# Patient Record
Sex: Female | Born: 1975 | Race: White | Hispanic: No | Marital: Married | Smoking: Never smoker
Health system: Southern US, Community
[De-identification: ages and names within clinical notes are randomized; demographics above are authoritative.]

## PROBLEM LIST (undated history)

## (undated) DIAGNOSIS — K219 Gastro-esophageal reflux disease without esophagitis: Secondary | ICD-10-CM

## (undated) DIAGNOSIS — J45909 Unspecified asthma, uncomplicated: Secondary | ICD-10-CM

## (undated) DIAGNOSIS — K589 Irritable bowel syndrome without diarrhea: Secondary | ICD-10-CM

## (undated) DIAGNOSIS — Z9889 Other specified postprocedural states: Secondary | ICD-10-CM

## (undated) DIAGNOSIS — R112 Nausea with vomiting, unspecified: Secondary | ICD-10-CM

## (undated) DIAGNOSIS — Z77120 Contact with and (suspected) exposure to mold (toxic): Secondary | ICD-10-CM

## (undated) DIAGNOSIS — D649 Anemia, unspecified: Secondary | ICD-10-CM

## (undated) HISTORY — DX: Irritable bowel syndrome, unspecified: K58.9

## (undated) HISTORY — DX: Contact with and (suspected) exposure to mold (toxic): Z77.120

---

## 1998-08-20 ENCOUNTER — Other Ambulatory Visit: Admission: RE | Admit: 1998-08-20 | Discharge: 1998-08-20 | Payer: Self-pay | Admitting: *Deleted

## 1999-09-09 ENCOUNTER — Other Ambulatory Visit: Admission: RE | Admit: 1999-09-09 | Discharge: 1999-09-09 | Payer: Self-pay | Admitting: *Deleted

## 2000-09-09 ENCOUNTER — Other Ambulatory Visit: Admission: RE | Admit: 2000-09-09 | Discharge: 2000-09-09 | Payer: Self-pay | Admitting: Obstetrics and Gynecology

## 2001-09-12 ENCOUNTER — Other Ambulatory Visit: Admission: RE | Admit: 2001-09-12 | Discharge: 2001-09-12 | Payer: Self-pay | Admitting: *Deleted

## 2002-09-19 ENCOUNTER — Other Ambulatory Visit: Admission: RE | Admit: 2002-09-19 | Discharge: 2002-09-19 | Payer: Self-pay | Admitting: *Deleted

## 2003-10-03 ENCOUNTER — Other Ambulatory Visit: Admission: RE | Admit: 2003-10-03 | Discharge: 2003-10-03 | Payer: Self-pay | Admitting: *Deleted

## 2004-12-17 ENCOUNTER — Other Ambulatory Visit: Admission: RE | Admit: 2004-12-17 | Discharge: 2004-12-17 | Payer: Self-pay | Admitting: *Deleted

## 2006-01-25 ENCOUNTER — Other Ambulatory Visit: Admission: RE | Admit: 2006-01-25 | Discharge: 2006-01-25 | Payer: Self-pay | Admitting: *Deleted

## 2007-03-04 ENCOUNTER — Other Ambulatory Visit: Admission: RE | Admit: 2007-03-04 | Discharge: 2007-03-04 | Payer: Self-pay | Admitting: *Deleted

## 2007-05-12 HISTORY — PX: CERVICAL CERCLAGE: SHX1329

## 2007-09-23 ENCOUNTER — Inpatient Hospital Stay (HOSPITAL_COMMUNITY): Admission: AD | Admit: 2007-09-23 | Discharge: 2007-09-23 | Payer: Self-pay | Admitting: Obstetrics and Gynecology

## 2007-09-28 ENCOUNTER — Inpatient Hospital Stay (HOSPITAL_COMMUNITY): Admission: AD | Admit: 2007-09-28 | Discharge: 2007-10-29 | Payer: Self-pay | Admitting: Obstetrics and Gynecology

## 2007-10-07 ENCOUNTER — Encounter: Payer: Self-pay | Admitting: Obstetrics and Gynecology

## 2007-10-25 ENCOUNTER — Encounter (INDEPENDENT_AMBULATORY_CARE_PROVIDER_SITE_OTHER): Payer: Self-pay | Admitting: Obstetrics & Gynecology

## 2007-10-31 ENCOUNTER — Encounter: Admission: RE | Admit: 2007-10-31 | Discharge: 2007-11-28 | Payer: Self-pay | Admitting: Obstetrics and Gynecology

## 2010-06-01 ENCOUNTER — Encounter: Payer: Self-pay | Admitting: Obstetrics and Gynecology

## 2010-09-23 NOTE — H&P (Signed)
Lisa Kelly, Lisa Kelly             ACCOUNT NO.:  [id number]   MEDICAL RECORD NO.:  [id number]           PATIENT TYPE:   LOCATION:                                 FACILITY:   PHYSICIAN:  Duke Salvia. Marcelle Overlie, M.D.DATE OF BIRTH:  08-20-1975   DATE OF ADMISSION:  DATE OF DISCHARGE:                              HISTORY & PHYSICAL   CHIEF COMPLAINT:  Preterm labor.   HISTORY OF PRESENT ILLNESS:  A 35 year old G1, P0, EDD is September 17,  22-6/7 weeks.  She has had an uncomplicated pregnancy with normal early  ultrasound except for showing a uterine fibroid that measures 7 x 5 x 6  cm at the fundus.  She has not had any previous cervical surgery.  Starting on May 15, she experienced some lower abdominal pain.  Cervix  was felt to be long and closed.  She was sent to [facility], was monitored,  received subcutaneous terbutaline, and was advised to come back today  for follow-up exam.  FFN was done today, and in the process of a routine  exam was noted to be dilated a centimeter and very thin.  An ultrasound  was done that showed breech presentation, fibroid was stable, there was  no measurable cervix with funneling noted.  We discussed incompetent  cervix and preterm labor, and she is admitted now for further  observation.   Blood type is A+.  For her past medical history, please see her  [facility] form.   PHYSICAL EXAMINATION:  Temperature 98.2, blood pressure 120/72.  HEENT:  Unremarkable.  NECK:  Supple without mass.  LUNGS:  Clear.  CARDIOVASCULAR:  Regular rate and rhythm without murmurs, rubs or  gallops.  BREASTS:  Not examined, 22-cm fundal height.  CERVIX:  Was 1, 90%, membranes intact.  EXTREMITIES/NEUROLOGIC:  Unremarkable.   IMPRESSION:  1. A 22-6/7 week intrauterine pregnancy.  2. Uterine fibroid is noted.  3. Premature labor.  4. Incompetent cervix.   PLAN:  Will admit for rest with tocolysis and consider possible cervical  cerclage.      Richard M. Marcelle Overlie, M.D.  Electronically Signed     RMH/MEDQ  D:  09/28/2007  T:  09/28/2007  Job:  [id number]

## 2010-09-23 NOTE — Op Note (Signed)
Lisa Kelly, BUSKIRK             ACCOUNT NO.:  [id number]   MEDICAL RECORD NO.:  [id number]          PATIENT TYPE:  OUT   LOCATION:  [facility]                           [facility]ACILITY:  [facility]   PHYSICIAN:  Zelphia Cairo, MD    DATE OF BIRTH:  Jul 30, 1975   DATE OF PROCEDURE:  09/29/2007  DATE OF DISCHARGE:                               OPERATIVE REPORT   PREOPERATIVE DIAGNOSIS:  Incompetent cervix.   POSTOPERATIVE DIAGNOSIS:  Incompetent cervix.   PROCEDURE:  Single McDonald rescue cerclage.   SURGEON:  Zelphia Cairo, MD   ASSISTANT:  Freddy Finner, MD   FINDINGS:  3 cm dilated cervix with exposed membranes.   ESTIMATED BLOOD LOSS:  Less than 50 mL.   COMPLICATIONS:  None.   ANESTHESIA:  Spinal.   PROCEDURE:  The patient was admitted to the hospital where she was noted  to have a cervix that was 1-cm dilated.  On ultrasound, there was no  measurable cervical length.  The patient was admitted to the hospital  where she was started on magnesium sulfate and Unasyn.  Risks, benefits,  and alternatives to rescue cerclage were discussed with the patient in  detail today.  We discussed the risks of ruptured membranes, infection,  preterm labor, and preterm delivery.  All of her questions were answered  satisfactorily and informed consent was obtained.   The patient was taken to the operating room where spinal anesthesia was  found to be adequate.  She was placed in the dorsal lithotomy position  using Allen stirrups.  She was prepped in sterile fashion.  Weighted  speculum was placed in the posterior vagina and a Deaver was placed  anteriorly.  On exam, the patient was noted to be visually 3-cm dilated  with exposed membranes.  The anterior and posterior lip of the cervix  were grasped with ring forceps.  The Mersilene tape was used to perform  a single McDonald cerclage.  Suture was tied anteriorly in the 12  o'clock position.  Kandice tolerated the procedure well.  Sponge, lap,  needle, and instrument counts were correct x2 at the end of the  procedure and she was taken to the recovery room in stable condition.      Zelphia Cairo, MD  Electronically Signed    GA/MEDQ  D:  09/29/2007  T:  09/30/2007  Job:  [id number]

## 2010-09-23 NOTE — Discharge Summary (Signed)
[name], [name]             ACCOUNT NO.:  [id number]   MEDICAL RECORD NO.:  [id number]          PATIENT TYPE:  INP   LOCATION:  9309                          [facility]ILITY:  [facility]   PHYSICIAN:  Lisa Kelly, M.D.DATE OF BIRTH:  Aug 05, 1975   DATE OF ADMISSION:  09/28/2007  DATE OF DISCHARGE:  10/29/2007                               DISCHARGE SUMMARY   ADMITTING DIAGNOSES:  1. Intrauterine pregnancy at 24-4/7th weeks estimated gestational age.  2. Preterm labor with failed tocolysis.  3. Footling breech.  4. Vaginal bleeding, suspect placental abruption.   DISCHARGE DIAGNOSES:  1. Status post low-transverse cesarean section.  2. Viable female infant.  3. Placental abruption.   PROCEDURE:  Primary low-transverse cesarean section with vertical  extension of the incisional site.   REASON FOR ADMISSION:  Please see written H&P.   HOSPITAL COURSE:  The patient is a 36 year old primigravida that was  admitted to [facility] at 22-6/7th weeks' estimated  gestational age.  The patient had had an uncomplicated pregnancy with  early normal ultrasound with exception of uterine fibroid that was  measuring 7 x 5 cm at the uterine fundus.  The patient had not had any  previous cervical surgeries.  Starting on approximately May 15, the  patient had experienced some low abdominal discomfort.  Cervix was felt  to be long and closed.  The patient had been sent to the maternity  admissions area where she had been monitored and received subcutaneous  terbutaline and was advised to come back for a followup exam.  Fetal  fibronectin had been performed and during her routine exam, it was noted  to be dilated a centimeter and very thin.  Ultrasound was performed  which revealed baby that was in the breech presentation and the fibroid  that was stable, however, there was no measurable cervix with funneling  noted at the time of the procedure.  The patient was now admitted with  known incompetent cervix and preterm labor, now she is admitted for  tocolysis of her labor and possible cervical cerclage.  The patient did  undergo cervical cerclage without loss of fluid or vaginal bleeding, the  patient continued on magnesium sulfate for tocolysis of her labor.  Steroids were given for enhancement of fetal lung maturity.  The patient  did well without significant contractions until approximately June 16,  at which time the patient noted some increasing contractions.  Even  though the magnesium sulfate was increased, the patient was given subcu  terbutaline.  Fetal heart tones remained reactive.  Vital signs were  stable.  Cervix was examined and found to be paper thin but stitch was  continued to be intact, however, the feet were palpable through the thin  cervix and given the increasing uterine activity that did not seemed to  respond to the magnesium sulfate, decision was made to proceed with a  primary low-transverse cesarean section.  The patient was then brought  to the operating room where general anesthesia was administered without  difficulty.  Foley was indwelling and a low-transverse abdominal  incision was made and carried sharply  down to the fascia.  Rectus sheath  was developed superior and inferiorly with blunt and sharp dissection.  The peritoneum was elevated and entered sharply and extended to the  external skin incision and rather then developed a bladder flap.  Incision was made approximately 1 cm above the bladder in a transverse  direction across the lower uterine segment.  Amniotic fluid was noted to  be clear and presence of some blood clots totaling approximately 100 mL  was noted.  Due to the marked uterine tone, difficulty was initially  experienced in identifying all the fetal parts which were deep into the  pelvis but the legs were eventually elevated into the field and a breech  extraction was accomplished without difficulty until an attempt  was made  to deliver the head and probably there was seen to be a Schatzki ring in  the lower segment with marked uterine tone.  The patient was given IV  nitro that relaxed the uterus and allowed the delivery of the head.  A  vertical incision was made approximately 3-4 cm along the midline of the  uterus and the fascia was extended in the same vertical direction.  The  infant was delivered without significant additional trauma other than  the abrasion that was noted in the groin.  The neonatal team was there  and baby was delivered.  The patient was noted to have a 3-4 cm myoma in  the posterior superior fundus in the intramural location.  Other small  subserosal myomas were also identified.  The tubes and ovaries were  normal.  Repair of the incision was made and the patient tolerated the  procedure well and was taken to the recovery room in stable condition.  On postoperative day #1, the patient was without complaint.  Vital signs  were stable.  She was afebrile.  Abdominal dressing was noted to be  clean, dry, and intact.  An erythematous skin rash was noted on the  anterior abdominal wall, suspect tape adhesive allergy.  Laboratory  findings showed a hemoglobin of 10.2.  On postoperative day #2, the  patient was without complaint.  Vital signs were stable.  She was  afebrile.  Fundus firm and nontender.  Abdominal dressing was noted to  be clean, dry, and intact.  On postoperative day #3, the patient was  without complaint.  Vital signs were stable.  She was afebrile.  Fundus  firm and nontender.  Incision was clean, dry, and intact.  Staples were  removed.  The patient was later discharged home.   CONDITION ON DISCHARGE:  Stable.   DIET:  Regular as tolerated.   ACTIVITY:  No heavy lifting, no driving x2 weeks, no vaginal entry.   FOLLOWUP:  The patient is to follow up in the office in 1-2 weeks for an  incision check.  She is to call for temperature greater than 100  degrees,  persistent nausea, vomiting, heavy vaginal bleeding, and/or  redness or drainage from incisional site.   DISCHARGE MEDICATIONS:  1. Tylox #30 one p.o. every 4-6 hours.  2. Motrin 600 mg every 6 hours.  3. Prenatal vitamins one p.o. daily.  4. Colace one p.o. daily.      Lisa Kelly, N.P.      Richard M. Marcelle Kelly, M.D.  Electronically Signed    CC/MEDQ  D:  11/15/2007  T:  11/15/2007  Job:  [id number]

## 2010-09-23 NOTE — Op Note (Signed)
NAMEWILMOTH, RASNIC             ACCOUNT NO.:  [id number]   MEDICAL RECORD NO.:  [id number]          PATIENT TYPE:  INP   LOCATION:  9309                          [facility]   PHYSICIAN:  Freddy Finner, M.D.   DATE OF BIRTH:  1975/09/14   DATE OF PROCEDURE:  10/25/2007  DATE OF DISCHARGE:                               OPERATIVE REPORT   PREOPERATIVE DIAGNOSES:  1. Intrauterine pregnancy, 26-4/7 weeks' gestation.  2. Incompetent cervix with cerclage and month long hospitalization.  3. Preterm labor with marked increase in uterine contractions and      increasing vaginal bleeding over the 8-12 hours prior to delivery.  4. Failed measures to abort preterm labor.  5. Footling breech presentation of a preterm infant.   POSTOPERATIVE DIAGNOSES:  1. Intrauterine pregnancy, 26-4/7 weeks' gestation.  2. Incompetent cervix with cerclage and month long hospitalization.  3. Preterm labor with marked increase in uterine contractions and      increasing vaginal bleeding over the 8-12 hours prior to delivery.  4. Failed measures to abort preterm labor.  5. Footling breech presentation of a preterm infant.   OPERATIVE PROCEDURE:  Primary low transverse cervical cesarean section  with vertical extension of incision for delivery of footling breech  presentation with delivery of viable female infant.  Arterial cord blood  sample was pH of 7.36.   SECONDARY PROCEDURE:  Removal of cerclage suture.   ESTIMATED INTRAOPERATIVE BLOOD LOSS:  800 mL.   INTRAOPERATIVE COMPLICATIONS:  Deep abrasion of the left groin of the  infant due to difficult elevation of small parts out of the pelvis and  delivery due to uterine contractions.  Secondary diagnosis:  Marginal  placental abruption.   ANESTHESIA:  General endotracheal.   The patient is a 35 year old who was found to have preterm labor and  preterm cervical change at approximately 22 weeks and a half of  gestation.  She was hospitalized, given  tocolysis, and reevaluated on  the day after admission in [place].  Her cervix was completely  essentially completely effaced by ultrasound and digital exam and said  to be 1-2 cm dilated.  On examination in the operating room under spinal  anesthesia, at the time of cerclage placement, the cervix was  approximately 3 mm long.  There was 3 cm of membranes showing in the  external os at the time of that procedure.  The cerclage was effectively  placed, however, and the patient has been maintained and treated in the  hospital for approximately four weeks.  After the events of today with  inability to suppress contractions with magnesium sulfate IV morphine  and subcutaneous terbutaline, it was elected to proceed with delivery as  soon as the marked increase in bleeding was noted.  She was brought to  the operating room, there placed under general anesthesia after the  abdomen was prepped and draped sterilely.  Foley catheter was  indwelling.  After induction of general anesthesia, a transverse lower  abdominal incision was made and carried sharply down to fascia.  Fascia  was entered sharply and extended to the external skin  incision.  Rectus  sheath was developed superiorly and inferiorly with blunt and sharp  dissection.  Rectus muscles were divided in the midline.  Bleeding  subfascial vessels were controlled with a Bovie.  The peritoneum was  elevated and entered sharply and extended to the external skin incision.  Rather than develop a bladder flap, an incision was made approximately 1  cm above the bladder in a transverse direction across the lower uterine  segment.  Amniotic fluid was clear with the presence of some blood  clots, probably totaling about 100 mL.  Due to marked uterine tone,  difficulty was initially experienced identifying all of the fetal parts  which were deep in the pelvis, but the legs were eventually elevated  into the field, and a breech extraction was  accomplished without  difficulty until an attempt was made to deliver the head.  Palpably,  there was seen to be a Schatzki's ring of the lower segment with marked  uterine tone.  The patient was given IV nitro that relaxed the uterus  and allowed delivery of the head.  This also required a vertical  incision of approximately 3-4 cm along the midline of the uterus, and  the fascia was extended from the same distance in a vertical direction.  The infant was then delivered without significant additional trauma over  than the abrasion as noted above.  The neonatal team was in place.  After collection of cord blood samples, the placenta and other parts of  conception were removed from the uterine cavity.  Uterus was delivered  onto the anterior abdominal wall.  There was an approximately 3- to 4-cm  myoma in the posterior superior fundus in an intramural location.  There  were a couple of very small subserosal myomas also identified.  The  tubes and ovaries were normal.  The uterine incision was then closed  with a running locking stitch of 0 Monocryl from angle to angle and the  vertical incision closed with a double layer running Monocryl the deep  layer and then superficial layer.  Irrigation was carried out.  A figure-  of-eight was required at the junction of the T and the uterine incision  for complete hemostasis.  Irrigation was carried out.  Hemostasis was  complete.  Pack, needle, and instrument counts were correct.  Irrigation  of the abdominal incision was then closed in layers.  Running 0 Monocryl  was used to close the peritoneum and reapproximate the rectus muscles.  Fascia was closed with a running double looped 0 PDS and with a running  double looped 0 PDS to close the fascial defect from above.  Subcutaneous tissue was approximated with a running 2-0 plain.  Skin was  closed with wide skin staples and 1/4-inch Steri-Strips.  The patient  tolerated the operative procedure well  and was awakened and taken to  recovery in good condition.      Freddy Finner, M.D.  Electronically Signed     WRN/MEDQ  D:  10/25/2007  T:  10/25/2007  Job:  [id number]

## 2011-02-04 LAB — CBC
HCT: 29.6 — ABNORMAL LOW
HCT: 34.7 — ABNORMAL LOW
Hemoglobin: 10.4 — ABNORMAL LOW
Hemoglobin: 12.1
MCHC: 34.9
MCHC: 35.2
MCV: 90.9
MCV: 90.9
Platelets: 281
Platelets: 301
Platelets: 433 — ABNORMAL HIGH
RBC: 3.25 — ABNORMAL LOW
RBC: 3.82 — ABNORMAL LOW
RDW: 12.2
RDW: 12.5
WBC: 14.6 — ABNORMAL HIGH
WBC: 8.1
WBC: 8.8

## 2011-02-04 LAB — SYPHILIS: RPR W/REFLEX TO RPR TITER AND TREPONEMAL ANTIBODIES, TRADITIONAL SCREENING AND DIAGNOSIS ALGORITHM: RPR Ser Ql: NONREACTIVE

## 2011-02-05 LAB — CBC
HCT: 29.2 — ABNORMAL LOW
HCT: 30.5 — ABNORMAL LOW
HCT: 29.5 — ABNORMAL LOW
Hemoglobin: 10.4 — ABNORMAL LOW
Hemoglobin: 10.9 — ABNORMAL LOW
Hemoglobin: 10.2 — ABNORMAL LOW
Hemoglobin: 11.5 — ABNORMAL LOW
MCHC: 35.5
MCHC: 34.7
RBC: 3.19 — ABNORMAL LOW
RBC: 3.14 — ABNORMAL LOW
RDW: 12.9
RDW: 13
RDW: 13.5
WBC: 10.4

## 2011-02-05 LAB — COMPREHENSIVE METABOLIC PANEL
ALT: 15
Calcium: 7.7 — ABNORMAL LOW
GFR calc Af Amer: >60
Glucose, Bld: 105 — ABNORMAL HIGH
Sodium: 136
Total Protein: 6.3

## 2011-02-05 LAB — MAGNESIUM: Magnesium: 5.4 — ABNORMAL HIGH

## 2011-11-05 ENCOUNTER — Ambulatory Visit (INDEPENDENT_AMBULATORY_CARE_PROVIDER_SITE_OTHER): Payer: BC Managed Care – PPO | Admitting: Internal Medicine

## 2011-11-05 DIAGNOSIS — R05 Cough: Secondary | ICD-10-CM

## 2011-11-05 DIAGNOSIS — R059 Cough, unspecified: Secondary | ICD-10-CM

## 2011-11-05 LAB — PULMONARY FUNCTION TEST

## 2011-11-05 NOTE — Progress Notes (Signed)
PFT done today. 

## 2011-12-25 ENCOUNTER — Encounter: Payer: Self-pay | Admitting: Internal Medicine

## 2011-12-25 ENCOUNTER — Ambulatory Visit: Payer: BC Managed Care – PPO | Admitting: Internal Medicine

## 2011-12-28 ENCOUNTER — Ambulatory Visit (INDEPENDENT_AMBULATORY_CARE_PROVIDER_SITE_OTHER): Payer: BC Managed Care – PPO | Admitting: Internal Medicine

## 2011-12-28 ENCOUNTER — Encounter: Payer: Self-pay | Admitting: Internal Medicine

## 2011-12-28 VITALS — BP 122/78 | HR 78 | Ht 68.0 in | Wt 200.0 lb

## 2011-12-28 DIAGNOSIS — J45909 Unspecified asthma, uncomplicated: Secondary | ICD-10-CM

## 2011-12-28 DIAGNOSIS — J4520 Mild intermittent asthma, uncomplicated: Secondary | ICD-10-CM

## 2011-12-28 DIAGNOSIS — J302 Other seasonal allergic rhinitis: Secondary | ICD-10-CM

## 2011-12-28 DIAGNOSIS — J309 Allergic rhinitis, unspecified: Secondary | ICD-10-CM

## 2011-12-28 MED ORDER — ALBUTEROL SULFATE HFA 108 (90 BASE) MCG/ACT IN AERS: 2.0000 | INHALATION_SPRAY | Freq: Four times a day (QID) | RESPIRATORY_TRACT | Status: DC | PRN

## 2011-12-28 NOTE — Patient Instructions (Addendum)
Sample and script for albuterol HFA rescue inhaler-   2 puffs up to 4 times daily as needed for wheeze/ chest tightness/ shortness of breath

## 2011-12-28 NOTE — Progress Notes (Signed)
12/28/11- 70 F never smoker referred courtesy of Dr Luciana Axe for c/o cough and wheezing. For 2 years has noted occasional wheeze at night, dry cough. Not affected by whether or season. Denies history of asthma but admits seasonal rhinitis. Occasional sinus infection. Prevacid trial did not help PPD skin test was negative with no history of exposure. CXR 11/05/11- WNL PFT 11/05/11-  WNL but with small airway response to BD. FEV1/FVC 0.82. 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  Prior to Admission medications   Medication Sig Start Date End Date Taking? Authorizing Provider  albuterol (PROVENTIL HFA;VENTOLIN HFA) 108 (90 BASE) MCG/ACT inhaler Inhale 2 puffs into the lungs every 6 (six) hours as needed for wheezing or shortness of breath. 12/28/11 12/27/12  Waymon Budge, MD   Past Medical History  Diagnosis Date  . IBS (irritable bowel syndrome)   . Preterm labor   . Suspected exposure to mold    Past Surgical History  Procedure Date  . Cesarean section 2009    premature birth   Family History  Problem Relation Age of Onset  . Colon polyps Father   . Prostate cancer Father    History   Social History  . Marital Status: Married    Spouse Name: N/A    Number of Children: 1  . Years of Education: N/A   Occupational History  . Social research officer, government [employer]   Social History Main Topics  . Smoking status: Never Smoker   . Smokeless tobacco: Never Used   Comment: lived with smoker for 3 yrs  . Alcohol Use: Yes     1 drink per month  . Drug Use: No  . Sexually Active: Not on file   Other Topics Concern  . Not on file   Social History Narrative  . No narrative on file   ROS-see HPI Constitutional:   No-   weight loss, night sweats, fevers, chills, fatigue, lassitude. HEENT:   No-  headaches, difficulty swallowing, tooth/dental problems, sore throat,       + sneezing, itching, ear ache, nasal congestion,  post nasal drip,  CV:  No-   chest pain, orthopnea, PND, swelling in lower extremities, anasarca,  dizziness, palpitations Resp: + shortness of breath with exertion or at rest.              No-   productive cough,  + non-productive cough,  No- coughing up of blood.              No-   change in color of mucus.  + wheezing.   Skin: No-   rash or lesions. GI:  No-   heartburn, indigestion, abdominal pain, nausea, vomiting, diarrhea,                 change in bowel habits, loss of appetite GU: No-   dysuria, change in color of urine, no urgency or frequency.  No- flank pain. MS:  No-   joint pain or swelling.  No- decreased range of motion.  No- back pain. Neuro-     nothing unusual Psych:  No- change in mood or affect. No depression or anxiety.  No memory loss.  OBJ- Physical Exam General- Alert, Oriented, Affect-appropriate, Distress- none acute. Overweight Skin- rash-none, lesions- none, excoriation- none Lymphadenopathy- none Head- atraumatic            Eyes- Gross vision intact, PERRLA, conjunctivae and secretions clear  Ears- Hearing, canals-normal            Nose- Clear, no-Septal dev, mucus, polyps, erosion, perforation             Throat- Mallampati II , mucosa red , drainage- none, tonsils- atrophic Neck- flexible , trachea midline, no stridor , thyroid nl, carotid no bruit Chest - symmetrical excursion , unlabored           Heart/CV- RRR , no murmur , no gallop  , no rub, nl s1 s2                           - JVD- none , edema- none, stasis changes- none, varices- none           Lung- clear to P&A, wheeze- none, cough- none , dullness-none, rub- none           Chest wall-  Abd- tender-no, distended-no, bowel sounds-present, HSM- no Br/ Gen/ Rectal- Not done, not indicated Extrem- cyanosis- none, clubbing, none, atrophy- none, strength- nl Neuro- grossly intact to observation

## 2012-01-02 DIAGNOSIS — J4520 Mild intermittent asthma, uncomplicated: Secondary | ICD-10-CM | POA: Insufficient documentation

## 2012-01-02 DIAGNOSIS — J302 Other seasonal allergic rhinitis: Secondary | ICD-10-CM | POA: Insufficient documentation

## 2012-01-02 NOTE — Assessment & Plan Note (Signed)
Working diagnosis mild intermittent asthma, based on small airway response to bronchodilator. There may be an allergic component given her description of mild seasonal allergic rhinitis Plan-therapeutic trial of albuterol rescue inhaler. Consider methacholine inhalation challenge test later if needed

## 2012-01-02 NOTE — Assessment & Plan Note (Signed)
Mild. OTC antihistamines should be sufficient

## 2012-02-29 ENCOUNTER — Ambulatory Visit: Payer: BC Managed Care – PPO | Admitting: Internal Medicine

## 2012-03-21 LAB — OB RESULTS CONSOLE HEPATITIS B SURFACE ANTIGEN: Hepatitis B Surface Ag: NEGATIVE

## 2012-03-21 LAB — OB RESULTS CONSOLE RPR: RPR: NONREACTIVE

## 2012-04-26 ENCOUNTER — Encounter (HOSPITAL_COMMUNITY): Payer: Self-pay | Admitting: Pharmacist

## 2012-04-28 ENCOUNTER — Encounter (HOSPITAL_COMMUNITY): Payer: Self-pay | Admitting: *Deleted

## 2012-05-06 NOTE — H&P (Signed)
Lisa Kelly, SHORE NO.:  [id number]  MEDICAL RECORD NO.:  [id number]  LOCATION:  PERIO                         FACILITY:  [facility]  PHYSICIAN:  Zelphia Cairo, MD    DATE OF BIRTH:  1976/02/28  DATE OF ADMISSION:  04/12/2012 DATE OF DISCHARGE:                             HISTORY & PHYSICAL   A 36 year old, G2, P-0-1-0-1, presents today for prophylactic cervical cerclage.  She has a history of cervical incompetence with an emergent cerclage placed during her last pregnancy.  She delivered her first pregnancy in 2009 at 80 and 1/2 weeks by C-section.  With this pregnancy, she denies any vaginal bleeding or pelvic pain.  Most recent ultrasound showed a cervical length greater than 5 cm.  PAST MEDICAL HISTORY:  Includes asthma, allergy-induced.  PAST SURGICAL HISTORY:  C-section.  ALLERGIES:  None.  MEDICATIONS:  Prenatal vitamins.  FAMILY HISTORY:  Prostate cancer.  PHYSICAL EXAMINATION:  VITAL SIGNS:  Afebrile with stable vital signs. GENERAL:  No acute distress. HEART:  Regular rate and rhythm. LUNGS:  Clear bilaterally. ABDOMEN:  Soft, nontender, nondistended. PELVIC:  Deferred today.  ASSESSMENT:  History of cervical incompetence.  PLAN:  Cervical cerclage.     Zelphia Cairo, MD     GA/MEDQ  D:  05/05/2012  T:  05/06/2012  Job:  [id number]

## 2012-05-09 ENCOUNTER — Ambulatory Visit (HOSPITAL_COMMUNITY): Payer: BC Managed Care – PPO | Admitting: Anesthesiology

## 2012-05-09 ENCOUNTER — Encounter (HOSPITAL_COMMUNITY): Payer: Self-pay | Admitting: *Deleted

## 2012-05-09 ENCOUNTER — Encounter (HOSPITAL_COMMUNITY)
Admission: RE | Disposition: A | Discharge status: Home / Self Care | Payer: Self-pay | Source: Ambulatory Visit | Attending: Obstetrics and Gynecology

## 2012-05-09 ENCOUNTER — Encounter (HOSPITAL_COMMUNITY): Payer: Self-pay | Admitting: Anesthesiology

## 2012-05-09 ENCOUNTER — Ambulatory Visit (HOSPITAL_COMMUNITY)
Admission: RE | Admit: 2012-05-09 | Discharge: 2012-05-09 | Disposition: A | Discharge status: Home / Self Care | Payer: BC Managed Care – PPO | Source: Ambulatory Visit | Attending: Obstetrics and Gynecology | Admitting: Obstetrics and Gynecology

## 2012-05-09 DIAGNOSIS — O3430 Maternal care for cervical incompetence, unspecified trimester: Secondary | ICD-10-CM | POA: Insufficient documentation

## 2012-05-09 HISTORY — PX: CERVICAL CERCLAGE: SHX1329

## 2012-05-09 HISTORY — DX: Gastro-esophageal reflux disease without esophagitis: K21.9

## 2012-05-09 HISTORY — DX: Anemia, unspecified: D64.9

## 2012-05-09 HISTORY — DX: Unspecified asthma, uncomplicated: J45.909

## 2012-05-09 LAB — CBC
Platelets: 219 10*3/uL (ref 150–400)
RBC: 4.48 MIL/uL (ref 3.87–5.11)
WBC: 6.6 10*3/uL (ref 4.0–10.5)

## 2012-05-09 SURGERY — CERCLAGE, CERVIX, VAGINAL APPROACH
Anesthesia: Spinal | Site: Cervix | Wound class: Clean Contaminated

## 2012-05-09 MED ORDER — HYDROCODONE-ACETAMINOPHEN 5-500 MG PO TABS: 1.0000 | ORAL_TABLET | Freq: Four times a day (QID) | ORAL | Status: DC | PRN

## 2012-05-09 MED ORDER — LACTATED RINGERS IV SOLN
INTRAVENOUS | Status: DC
  Administered 2012-05-09 (×4): via INTRAVENOUS

## 2012-05-09 MED ORDER — FENTANYL CITRATE 0.05 MG/ML IJ SOLN
INTRAMUSCULAR | Status: AC
  Administered 2012-05-09: 25 ug via INTRAVENOUS
  Filled 2012-05-09: qty 2

## 2012-05-09 MED ORDER — CEFAZOLIN SODIUM-DEXTROSE 2-3 GM-% IV SOLR
2.0000 g | INTRAVENOUS | Status: AC
  Administered 2012-05-09: 2 g via INTRAVENOUS

## 2012-05-09 MED ORDER — MEPERIDINE HCL 25 MG/ML IJ SOLN: 6.2500 mg | INTRAMUSCULAR | Status: DC | PRN

## 2012-05-09 MED ORDER — HYDROCODONE-ACETAMINOPHEN 5-325 MG PO TABS
ORAL_TABLET | ORAL | Status: AC
  Filled 2012-05-09: qty 1

## 2012-05-09 MED ORDER — BUPIVACAINE IN DEXTROSE 0.75-8.25 % IT SOLN
INTRATHECAL | Status: DC | PRN
  Administered 2012-05-09: 1.4 mL via INTRATHECAL

## 2012-05-09 MED ORDER — METOCLOPRAMIDE HCL 5 MG/ML IJ SOLN: 10.0000 mg | Freq: Once | INTRAMUSCULAR | Status: DC | PRN

## 2012-05-09 MED ORDER — HYDROCODONE-ACETAMINOPHEN 5-325 MG PO TABS
1.0000 | ORAL_TABLET | Freq: Once | ORAL | Status: AC
  Administered 2012-05-09: 1 via ORAL

## 2012-05-09 MED ORDER — CEFAZOLIN SODIUM-DEXTROSE 2-3 GM-% IV SOLR
INTRAVENOUS | Status: AC
  Filled 2012-05-09: qty 50

## 2012-05-09 MED ORDER — FENTANYL CITRATE 0.05 MG/ML IJ SOLN
25.0000 ug | INTRAMUSCULAR | Status: DC | PRN
  Administered 2012-05-09: 25 ug via INTRAVENOUS

## 2012-05-09 SURGICAL SUPPLY — 17 items
CATH ROBINSON RED A/P 16FR (CATHETERS) ×2 IMPLANT
CLOTH BEACON ORANGE TIMEOUT ST (SAFETY) ×2 IMPLANT
COUNTER NEEDLE 1200 MAGNETIC (NEEDLE) ×2 IMPLANT
GLOVE BIO SURGEON STRL SZ 6.5 (GLOVE) ×2 IMPLANT
GLOVE BIOGEL PI IND STRL 7.0 (GLOVE) ×2 IMPLANT
GLOVE BIOGEL PI INDICATOR 7.0 (GLOVE) ×2
GOWN STRL REIN XL XLG (GOWN DISPOSABLE) ×4 IMPLANT
NEEDLE MAYO .5 CIRCLE (NEEDLE) ×2 IMPLANT
PACK VAGINAL MINOR WOMEN LF (CUSTOM PROCEDURE TRAY) ×2 IMPLANT
PAD OB MATERNITY 4.3X12.25 (PERSONAL CARE ITEMS) ×2 IMPLANT
PAD PREP 24X48 CUFFED NSTRL (MISCELLANEOUS) ×2 IMPLANT
SUT ETHIBOND  5 (SUTURE) ×1
SUT ETHIBOND 5 (SUTURE) IMPLANT
TOWEL OR 17X24 6PK STRL BLUE (TOWEL DISPOSABLE) ×4 IMPLANT
TUBING NON-CON 1/4 X 20 CONN (TUBING) IMPLANT
WATER STERILE IRR 1000ML POUR (IV SOLUTION) ×2 IMPLANT
YANKAUER SUCT BULB TIP NO VENT (SUCTIONS) IMPLANT

## 2012-05-09 NOTE — Preoperative (Signed)
Beta Blockers   Reason not to administer Beta Blockers:Not Applicable 

## 2012-05-09 NOTE — Transfer of Care (Signed)
Immediate Anesthesia Transfer of Care Note  Patient: Lisa Kelly  Procedure(s) Performed: Procedure(s) (LRB) with comments: CERCLAGE CERVICAL (N/A)  Patient Location: PACU  Anesthesia Type:Spinal  Level of Consciousness: awake, alert , oriented and patient cooperative  Airway & Oxygen Therapy: Patient Spontanous Breathing  Post-op Assessment: Report given to PACU RN and Post -op Vital signs reviewed and stable  Post vital signs: Reviewed and stable  Complications: No apparent anesthesia complications

## 2012-05-09 NOTE — Anesthesia Procedure Notes (Signed)
Spinal  Patient location during procedure: OR Start time: 05/09/2012 1:12 PM Staffing Anesthesiologist: Rohin Krejci A. Performed by: anesthesiologist  Preanesthetic Checklist Completed: patient identified, site marked, surgical consent, pre-op evaluation, timeout performed, IV checked, risks and benefits discussed and monitors and equipment checked Spinal Block Patient position: sitting Prep: site prepped and draped and DuraPrep Patient monitoring: heart rate, cardiac monitor, continuous pulse ox and blood pressure Approach: midline Location: L3-4 Injection technique: single-shot Needle Needle type: Sprotte  Needle gauge: 24 G Needle length: 5 cm Assessment Sensory level: T6 Additional Notes Patient tolerated procedure well. Adequate sensory block.

## 2012-05-09 NOTE — Anesthesia Preprocedure Evaluation (Signed)
Anesthesia Evaluation    Airway Mallampati: II TM Distance: >3 FB Neck ROM: full    Dental No notable dental hx. (+) Teeth Intact   Pulmonary asthma ,  breath sounds clear to auscultation  Pulmonary exam normal       Cardiovascular negative cardio ROS  Rhythm:regular Rate:Normal     Neuro/Psych negative neurological ROS  negative psych ROS   GI/Hepatic negative GI ROS, Neg liver ROS, GERD-  Controlled,  Endo/Other  negative endocrine ROS  Renal/GU negative Renal ROS  negative genitourinary   Musculoskeletal   Abdominal Normal abdominal exam  (+)   Peds  Hematology negative hematology ROS (+) anemia ,   Anesthesia Other Findings   Reproductive/Obstetrics (+) Pregnancy 14 wks Incompetent Cervix                           Anesthesia Physical Anesthesia Plan  ASA: II  Anesthesia Plan: Spinal   Post-op Pain Management:    Induction:   Airway Management Planned:   Additional Equipment:   Intra-op Plan:   Post-operative Plan:   Informed Consent: I have reviewed the patients History and Physical, chart, labs and discussed the procedure including the risks, benefits and alternatives for the proposed anesthesia with the patient or authorized representative who has indicated his/her understanding and acceptance.   Dental Advisory Given  Plan Discussed with: Anesthesiologist, CRNA and Surgeon  Anesthesia Plan Comments:         Anesthesia Quick Evaluation

## 2012-05-09 NOTE — Progress Notes (Signed)
Discussed plan of care with pt.  Questions answered, informed consent.

## 2012-05-09 NOTE — Anesthesia Postprocedure Evaluation (Signed)
  Anesthesia Post-op Note  Patient: Lisa Kelly  Procedure(s) Performed: Procedure(s) (LRB) with comments: CERCLAGE CERVICAL (N/A)  Patient Location: PACU  Anesthesia Type:Spinal  Level of Consciousness: awake, alert  and oriented  Airway and Oxygen Therapy: Patient Spontanous Breathing  Post-op Pain: none  Post-op Assessment: Post-op Vital signs reviewed, Patient's Cardiovascular Status Stable, Respiratory Function Stable, Patent Airway, No signs of Nausea or vomiting, Pain level controlled, No headache, No backache, No residual numbness and No residual motor weakness  Post-op Vital Signs: Reviewed and stable  Complications: No apparent anesthesia complications

## 2012-05-10 NOTE — Op Note (Signed)
NAMEANNELISA, RYBACK NO.:  [id number]  MEDICAL RECORD NO.:  [id number]  LOCATION:  [facility]                          FACILITY:  [facility]  PHYSICIAN:  Zelphia Cairo, MD    DATE OF BIRTH:  [birth year]  DATE OF PROCEDURE: DATE OF DISCHARGE:  05/09/2012                              OPERATIVE REPORT   PREOPERATIVE DIAGNOSIS:  History of incompetent cervix with preterm delivery.  POSTOPERATIVE DIAGNOSIS:  History of incompetent cervix with preterm delivery.  PROCEDURE:  Cervical cerclage.  SURGEON:  Zelphia Cairo, MD  ANESTHESIA:  Spinal, Dr. Malen Gauze.  COMPLICATIONS:  None.  CONDITION:  Stable to recovery room.  PROCEDURE:  The patient was taken to the operating room.  After informed consent was obtained, she was given spinal anesthesia.  Once anesthesia was found to be adequate, she was placed in the dorsal lithotomy position using Allen stirrups.  She was prepped and draped in sterile fashion.  An in and out catheter was used to sterilely drain her bladder.  Weighted speculum was placed in the posterior vagina and Deaver was placed anteriorly.  The anterior lip of the cervix was grasped with ring forceps and a single McDonald cerclage was performed using #5 Ethibond.  Suture was tied at 12 o'clock.  A long tail was left for easy identification and removal.  Sutures were trimmed.  Retractors were removed and the patient was taken to the recovery room in stable condition.  Sponge, lap, needle, and instrument counts were correct x2 at the end of the case.     Zelphia Cairo, MD     GA/MEDQ  D:  05/09/2012  T:  05/10/2012  Job:  [id number]

## 2012-05-11 ENCOUNTER — Encounter (HOSPITAL_COMMUNITY): Payer: Self-pay | Admitting: Obstetrics and Gynecology

## 2012-10-24 ENCOUNTER — Encounter (HOSPITAL_COMMUNITY): Payer: Self-pay | Admitting: Pharmacist

## 2012-10-27 ENCOUNTER — Encounter (HOSPITAL_COMMUNITY): Payer: Self-pay

## 2012-10-31 ENCOUNTER — Encounter (HOSPITAL_COMMUNITY)
Admission: RE | Admit: 2012-10-31 | Discharge: 2012-10-31 | Disposition: A | Discharge status: Home / Self Care | Payer: BC Managed Care – PPO | Source: Ambulatory Visit | Attending: Obstetrics and Gynecology | Admitting: Obstetrics and Gynecology

## 2012-10-31 ENCOUNTER — Encounter (HOSPITAL_COMMUNITY): Payer: Self-pay

## 2012-10-31 HISTORY — DX: Nausea with vomiting, unspecified: Z98.890

## 2012-10-31 HISTORY — DX: Nausea with vomiting, unspecified: R11.2

## 2012-10-31 LAB — TYPE AND SCREEN: Antibody Screen: NEGATIVE

## 2012-10-31 LAB — RPR: RPR Ser Ql: NONREACTIVE

## 2012-10-31 LAB — CBC
HCT: 36.8 % (ref 36.0–46.0)
Hemoglobin: 12.7 g/dL (ref 12.0–15.0)
MCV: 91.1 fL (ref 78.0–100.0)
RBC: 4.04 MIL/uL (ref 3.87–5.11)
WBC: 7.4 10*3/uL (ref 4.0–10.5)

## 2012-10-31 NOTE — Patient Instructions (Addendum)
   Your procedure is scheduled on: Wednesday 11/02/12 Enter through the Main Entrance of [facility] at: 6:00 am  Pick up the phone at the desk and dial [phone] and inform us of your arrival.  Please call this number if you have any problems the morning of surgery: [phone]  Remember: Do not eat any solid foods or drink any liquids after midnight on: Wed 11/02/12  Do not wear jewelry, make-up, or FINGER nail polish No metal in your hair or on your body. Do not wear lotions, powders, perfumes. You may wear deodorant.  Please use your CHG wash as directed prior to surgery.  Do not shave anywhere for at least 12 hours prior to first CHG shower.  Do not bring valuables to the hospital. Contacts, Dentures and Partial Plates may not be worn to OR  Leave suitcase in the car. After Surgery it may be brought to your room.  For patients being admitted to the hospital, checkout time is 11:00am the day of discharge.

## 2012-10-31 NOTE — H&P (Signed)
37 yo presents for repeat c-section  Past history - see hollister  AF< VSS Gen - NAD Abd - gravid, NT CV - RRR Lungs - clear PV - deferred Ext - NT, no edema  A/P:  Previous c-section, desires rpt

## 2012-11-02 ENCOUNTER — Encounter (HOSPITAL_COMMUNITY): Payer: Self-pay | Admitting: Anesthesiology

## 2012-11-02 ENCOUNTER — Encounter (HOSPITAL_COMMUNITY): Payer: Self-pay | Admitting: Certified Registered"

## 2012-11-02 ENCOUNTER — Encounter (HOSPITAL_COMMUNITY)
Admission: AD | Disposition: A | Discharge status: Home / Self Care | Payer: Self-pay | Source: Ambulatory Visit | Attending: Obstetrics and Gynecology

## 2012-11-02 ENCOUNTER — Inpatient Hospital Stay (HOSPITAL_COMMUNITY): Payer: BC Managed Care – PPO | Admitting: Anesthesiology

## 2012-11-02 ENCOUNTER — Inpatient Hospital Stay (HOSPITAL_COMMUNITY)
Admission: AD | Admit: 2012-11-02 | Discharge: 2012-11-04 | DRG: 371 | Disposition: A | Discharge status: Home / Self Care | Payer: BC Managed Care – PPO | Source: Ambulatory Visit | Attending: Obstetrics and Gynecology | Admitting: Obstetrics and Gynecology

## 2012-11-02 DIAGNOSIS — O09529 Supervision of elderly multigravida, unspecified trimester: Secondary | ICD-10-CM | POA: Diagnosis present

## 2012-11-02 DIAGNOSIS — O3430 Maternal care for cervical incompetence, unspecified trimester: Secondary | ICD-10-CM | POA: Diagnosis present

## 2012-11-02 DIAGNOSIS — O34219 Maternal care for unspecified type scar from previous cesarean delivery: Principal | ICD-10-CM | POA: Diagnosis present

## 2012-11-02 SURGERY — Surgical Case
Anesthesia: Spinal | Wound class: Clean Contaminated

## 2012-11-02 MED ORDER — WITCH HAZEL-GLYCERIN EX PADS: 1.0000 "application " | MEDICATED_PAD | CUTANEOUS | Status: DC | PRN

## 2012-11-02 MED ORDER — ONDANSETRON HCL 4 MG/2ML IJ SOLN
INTRAMUSCULAR | Status: AC
  Filled 2012-11-02: qty 2

## 2012-11-02 MED ORDER — KETOROLAC TROMETHAMINE 30 MG/ML IJ SOLN: 30.0000 mg | Freq: Four times a day (QID) | INTRAMUSCULAR | Status: AC | PRN

## 2012-11-02 MED ORDER — LANOLIN HYDROUS EX OINT: 1.0000 "application " | TOPICAL_OINTMENT | CUTANEOUS | Status: DC | PRN

## 2012-11-02 MED ORDER — SENNOSIDES-DOCUSATE SODIUM 8.6-50 MG PO TABS
2.0000 | ORAL_TABLET | Freq: Every day | ORAL | Status: DC
  Administered 2012-11-02 – 2012-11-03 (×2): 2 via ORAL

## 2012-11-02 MED ORDER — PHENYLEPHRINE HCL 10 MG/ML IJ SOLN
INTRAMUSCULAR | Status: DC | PRN
  Administered 2012-11-02: 80 ug via INTRAVENOUS
  Administered 2012-11-02: 40 ug via INTRAVENOUS
  Administered 2012-11-02 (×2): 80 ug via INTRAVENOUS

## 2012-11-02 MED ORDER — SCOPOLAMINE 1 MG/3DAYS TD PT72: 1.0000 | MEDICATED_PATCH | Freq: Once | TRANSDERMAL | Status: DC

## 2012-11-02 MED ORDER — 0.9 % SODIUM CHLORIDE (POUR BTL) OPTIME
TOPICAL | Status: DC | PRN
  Administered 2012-11-02: 1000 mL

## 2012-11-02 MED ORDER — LACTATED RINGERS IV SOLN
INTRAVENOUS | Status: DC | PRN
  Administered 2012-11-02 (×4): via INTRAVENOUS

## 2012-11-02 MED ORDER — OXYTOCIN 40 UNITS IN LACTATED RINGERS INFUSION - SIMPLE MED: 62.5000 mL/h | INTRAVENOUS | Status: AC

## 2012-11-02 MED ORDER — GLYCOPYRROLATE 0.2 MG/ML IJ SOLN
INTRAMUSCULAR | Status: DC | PRN
  Administered 2012-11-02: 0.2 mg via INTRAVENOUS

## 2012-11-02 MED ORDER — FENTANYL CITRATE 0.05 MG/ML IJ SOLN
INTRAMUSCULAR | Status: AC
  Filled 2012-11-02: qty 2

## 2012-11-02 MED ORDER — MEPERIDINE HCL 25 MG/ML IJ SOLN: 6.2500 mg | INTRAMUSCULAR | Status: DC | PRN

## 2012-11-02 MED ORDER — PRENATAL MULTIVITAMIN CH
1.0000 | ORAL_TABLET | Freq: Every day | ORAL | Status: DC
  Administered 2012-11-03: 1 via ORAL
  Filled 2012-11-02 (×3): qty 1

## 2012-11-02 MED ORDER — KETOROLAC TROMETHAMINE 30 MG/ML IJ SOLN
INTRAMUSCULAR | Status: AC
  Administered 2012-11-02: 30 mg via INTRAMUSCULAR
  Filled 2012-11-02: qty 1

## 2012-11-02 MED ORDER — DIPHENHYDRAMINE HCL 25 MG PO CAPS: 25.0000 mg | ORAL_CAPSULE | Freq: Four times a day (QID) | ORAL | Status: DC | PRN

## 2012-11-02 MED ORDER — IBUPROFEN 600 MG PO TABS
600.0000 mg | ORAL_TABLET | Freq: Four times a day (QID) | ORAL | Status: DC
  Administered 2012-11-02 – 2012-11-04 (×7): 600 mg via ORAL
  Filled 2012-11-02 (×8): qty 1

## 2012-11-02 MED ORDER — ONDANSETRON HCL 4 MG/2ML IJ SOLN: 4.0000 mg | Freq: Three times a day (TID) | INTRAMUSCULAR | Status: DC | PRN

## 2012-11-02 MED ORDER — NALBUPHINE HCL 10 MG/ML IJ SOLN
5.0000 mg | INTRAMUSCULAR | Status: DC | PRN
  Filled 2012-11-02: qty 1

## 2012-11-02 MED ORDER — METHYLERGONOVINE MALEATE 0.2 MG PO TABS
0.2000 mg | ORAL_TABLET | Freq: Three times a day (TID) | ORAL | Status: DC
  Administered 2012-11-02 – 2012-11-04 (×4): 0.2 mg via ORAL
  Filled 2012-11-02 (×4): qty 1

## 2012-11-02 MED ORDER — MORPHINE SULFATE (PF) 0.5 MG/ML IJ SOLN
INTRAMUSCULAR | Status: DC | PRN
  Administered 2012-11-02: .5 mg via INTRATHECAL

## 2012-11-02 MED ORDER — SCOPOLAMINE 1 MG/3DAYS TD PT72
MEDICATED_PATCH | TRANSDERMAL | Status: AC
  Administered 2012-11-02: 1.5 mg via TRANSDERMAL
  Filled 2012-11-02: qty 1

## 2012-11-02 MED ORDER — MENTHOL 3 MG MT LOZG: 1.0000 | LOZENGE | OROMUCOSAL | Status: DC | PRN

## 2012-11-02 MED ORDER — DIPHENHYDRAMINE HCL 25 MG PO CAPS
25.0000 mg | ORAL_CAPSULE | ORAL | Status: DC | PRN
  Filled 2012-11-02: qty 1

## 2012-11-02 MED ORDER — DIPHENHYDRAMINE HCL 50 MG/ML IJ SOLN: 25.0000 mg | INTRAMUSCULAR | Status: DC | PRN

## 2012-11-02 MED ORDER — DIPHENHYDRAMINE HCL 50 MG/ML IJ SOLN: 12.5000 mg | INTRAMUSCULAR | Status: DC | PRN

## 2012-11-02 MED ORDER — PRENATAL MULTIVITAMIN CH
1.0000 | ORAL_TABLET | Freq: Every day | ORAL | Status: DC
  Administered 2012-11-02 – 2012-11-03 (×2): 1 via ORAL

## 2012-11-02 MED ORDER — TETANUS-DIPHTH-ACELL PERTUSSIS 5-2.5-18.5 LF-MCG/0.5 IM SUSP: 0.5000 mL | Freq: Once | INTRAMUSCULAR | Status: DC

## 2012-11-02 MED ORDER — PHENYLEPHRINE 40 MCG/ML (10ML) SYRINGE FOR IV PUSH (FOR BLOOD PRESSURE SUPPORT)
PREFILLED_SYRINGE | INTRAVENOUS | Status: AC
  Filled 2012-11-02: qty 15

## 2012-11-02 MED ORDER — METOCLOPRAMIDE HCL 5 MG/ML IJ SOLN: 10.0000 mg | Freq: Three times a day (TID) | INTRAMUSCULAR | Status: DC | PRN

## 2012-11-02 MED ORDER — ONDANSETRON HCL 4 MG PO TABS: 4.0000 mg | ORAL_TABLET | ORAL | Status: DC | PRN

## 2012-11-02 MED ORDER — DEXTROSE IN LACTATED RINGERS 5 % IV SOLN
INTRAVENOUS | Status: DC
  Administered 2012-11-02: 21:00:00 via INTRAVENOUS

## 2012-11-02 MED ORDER — CEFAZOLIN SODIUM-DEXTROSE 2-3 GM-% IV SOLR
INTRAVENOUS | Status: AC
  Filled 2012-11-02: qty 50

## 2012-11-02 MED ORDER — NALOXONE HCL 1 MG/ML IJ SOLN
1.0000 ug/kg/h | INTRAMUSCULAR | Status: DC | PRN
  Filled 2012-11-02: qty 2

## 2012-11-02 MED ORDER — NALOXONE HCL 0.4 MG/ML IJ SOLN: 0.4000 mg | INTRAMUSCULAR | Status: DC | PRN

## 2012-11-02 MED ORDER — PROMETHAZINE HCL 25 MG/ML IJ SOLN: 6.2500 mg | INTRAMUSCULAR | Status: DC | PRN

## 2012-11-02 MED ORDER — OXYTOCIN 10 UNIT/ML IJ SOLN
40.0000 [IU] | INTRAVENOUS | Status: DC | PRN
  Administered 2012-11-02: 40 [IU] via INTRAVENOUS

## 2012-11-02 MED ORDER — SIMETHICONE 80 MG PO CHEW
80.0000 mg | CHEWABLE_TABLET | Freq: Three times a day (TID) | ORAL | Status: DC
  Administered 2012-11-02 – 2012-11-04 (×7): 80 mg via ORAL

## 2012-11-02 MED ORDER — MEDROXYPROGESTERONE ACETATE 150 MG/ML IM SUSP: 150.0000 mg | INTRAMUSCULAR | Status: DC | PRN

## 2012-11-02 MED ORDER — MEASLES, MUMPS & RUBELLA VAC ~~LOC~~ INJ
0.5000 mL | INJECTION | Freq: Once | SUBCUTANEOUS | Status: DC
  Filled 2012-11-02: qty 0.5

## 2012-11-02 MED ORDER — SIMETHICONE 80 MG PO CHEW: 80.0000 mg | CHEWABLE_TABLET | ORAL | Status: DC | PRN

## 2012-11-02 MED ORDER — OXYCODONE-ACETAMINOPHEN 5-325 MG PO TABS
1.0000 | ORAL_TABLET | ORAL | Status: DC | PRN
  Administered 2012-11-03 – 2012-11-04 (×4): 1 via ORAL
  Filled 2012-11-02 (×4): qty 1

## 2012-11-02 MED ORDER — FENTANYL CITRATE 0.05 MG/ML IJ SOLN
INTRAMUSCULAR | Status: DC | PRN
  Administered 2012-11-02: 25 ug via INTRATHECAL

## 2012-11-02 MED ORDER — DIBUCAINE 1 % RE OINT: 1.0000 "application " | TOPICAL_OINTMENT | RECTAL | Status: DC | PRN

## 2012-11-02 MED ORDER — SODIUM CHLORIDE 0.9 % IJ SOLN: 3.0000 mL | INTRAMUSCULAR | Status: DC | PRN

## 2012-11-02 MED ORDER — BUPIVACAINE IN DEXTROSE 0.75-8.25 % IT SOLN
INTRATHECAL | Status: DC | PRN
  Administered 2012-11-02: 1.8 mL via INTRATHECAL

## 2012-11-02 MED ORDER — MORPHINE SULFATE 0.5 MG/ML IJ SOLN
INTRAMUSCULAR | Status: AC
  Filled 2012-11-02: qty 10

## 2012-11-02 MED ORDER — ONDANSETRON HCL 4 MG/2ML IJ SOLN: 4.0000 mg | INTRAMUSCULAR | Status: DC | PRN

## 2012-11-02 MED ORDER — SCOPOLAMINE 1 MG/3DAYS TD PT72
1.0000 | MEDICATED_PATCH | Freq: Once | TRANSDERMAL | Status: DC
  Administered 2012-11-02: 1.5 mg via TRANSDERMAL

## 2012-11-02 MED ORDER — OXYTOCIN 10 UNIT/ML IJ SOLN
INTRAMUSCULAR | Status: AC
  Filled 2012-11-02: qty 4

## 2012-11-02 MED ORDER — ACETAMINOPHEN 10 MG/ML IV SOLN
1000.0000 mg | Freq: Four times a day (QID) | INTRAVENOUS | Status: AC | PRN
  Administered 2012-11-02: 1000 mg via INTRAVENOUS
  Filled 2012-11-02: qty 100

## 2012-11-02 MED ORDER — FENTANYL CITRATE 0.05 MG/ML IJ SOLN: 25.0000 ug | INTRAMUSCULAR | Status: DC | PRN

## 2012-11-02 MED ORDER — MIDAZOLAM HCL 2 MG/2ML IJ SOLN: 0.5000 mg | Freq: Once | INTRAMUSCULAR | Status: DC | PRN

## 2012-11-02 MED ORDER — ONDANSETRON HCL 4 MG/2ML IJ SOLN
INTRAMUSCULAR | Status: DC | PRN
  Administered 2012-11-02: 4 mg via INTRAVENOUS

## 2012-11-02 MED ORDER — LACTATED RINGERS IV SOLN
Freq: Once | INTRAVENOUS | Status: AC
  Administered 2012-11-02: 07:00:00 via INTRAVENOUS

## 2012-11-02 MED ORDER — CEFAZOLIN SODIUM-DEXTROSE 2-3 GM-% IV SOLR
2.0000 g | INTRAVENOUS | Status: AC
  Administered 2012-11-02: 2 g via INTRAVENOUS

## 2012-11-02 SURGICAL SUPPLY — 32 items
ADH SKN CLS APL DERMABOND .7 (GAUZE/BANDAGES/DRESSINGS) ×1
CLAMP CORD UMBIL (MISCELLANEOUS) IMPLANT
CLOTH BEACON ORANGE TIMEOUT ST (SAFETY) ×2 IMPLANT
DERMABOND ADVANCED (GAUZE/BANDAGES/DRESSINGS) ×1
DERMABOND ADVANCED .7 DNX12 (GAUZE/BANDAGES/DRESSINGS) ×1 IMPLANT
DRAPE LG THREE QUARTER DISP (DRAPES) ×2 IMPLANT
DRSG OPSITE POSTOP 4X10 (GAUZE/BANDAGES/DRESSINGS) ×3 IMPLANT
DURAPREP 26ML APPLICATOR (WOUND CARE) ×2 IMPLANT
ELECT REM PT RETURN 9FT ADLT (ELECTROSURGICAL) ×2
ELECTRODE REM PT RTRN 9FT ADLT (ELECTROSURGICAL) ×1 IMPLANT
EXTRACTOR VACUUM M CUP 4 TUBE (SUCTIONS) IMPLANT
GLOVE BIO SURGEON STRL SZ 6.5 (GLOVE) ×2 IMPLANT
GLOVE BIOGEL PI IND STRL 7.0 (GLOVE) ×1 IMPLANT
GLOVE BIOGEL PI INDICATOR 7.0 (GLOVE) ×4
GLOVE SURG SS PI 7.0 STRL IVOR (GLOVE) ×1 IMPLANT
GOWN STRL REIN XL XLG (GOWN DISPOSABLE) ×5 IMPLANT
KIT ABG SYR 3ML LUER SLIP (SYRINGE) ×2 IMPLANT
NDL HYPO 25X5/8 SAFETYGLIDE (NEEDLE) ×1 IMPLANT
NEEDLE HYPO 25X5/8 SAFETYGLIDE (NEEDLE) ×2 IMPLANT
NS IRRIG 1000ML POUR BTL (IV SOLUTION) ×2 IMPLANT
PACK C SECTION WH (CUSTOM PROCEDURE TRAY) ×2 IMPLANT
PAD OB MATERNITY 4.3X12.25 (PERSONAL CARE ITEMS) ×2 IMPLANT
STAPLER VISISTAT 35W (STAPLE) ×1 IMPLANT
SUT CHROMIC 0 CT 802H (SUTURE) IMPLANT
SUT CHROMIC 0 CTX 36 (SUTURE) ×6 IMPLANT
SUT MON AB-0 CT1 36 (SUTURE) ×2 IMPLANT
SUT PDS AB 0 CTX 60 (SUTURE) ×2 IMPLANT
SUT PLAIN 0 NONE (SUTURE) IMPLANT
SUT VIC AB 4-0 KS 27 (SUTURE) IMPLANT
TOWEL OR 17X24 6PK STRL BLUE (TOWEL DISPOSABLE) ×6 IMPLANT
TRAY FOLEY CATH 14FR (SET/KITS/TRAYS/PACK) IMPLANT
WATER STERILE IRR 1000ML POUR (IV SOLUTION) ×2 IMPLANT

## 2012-11-02 NOTE — Anesthesia Postprocedure Evaluation (Signed)
  Anesthesia Post-op Note  Patient: Lisa Kelly  Procedure(s) Performed: Procedure(s) with comments: REPEAT CESAREAN SECTION, removal of cervical cerclage (N/A) - repeat edc 11/06/12  Patient Location: PACU  Anesthesia Type:Spinal  Level of Consciousness: awake, alert  and oriented  Airway and Oxygen Therapy: Patient Spontanous Breathing  Post-op Pain: none  Post-op Assessment: Post-op Vital signs reviewed, Patient's Cardiovascular Status Stable, Respiratory Function Stable, Patent Airway, No signs of Nausea or vomiting, Pain level controlled, No headache and No backache  Post-op Vital Signs: Reviewed and stable  Complications: No apparent anesthesia complications

## 2012-11-02 NOTE — Anesthesia Postprocedure Evaluation (Signed)
  Anesthesia Post-op Note  Patient: Lisa Kelly  Procedure(s) Performed: Procedure(s) with comments: REPEAT CESAREAN SECTION, removal of cervical cerclage (N/A) - repeat edc 11/06/12  Patient Location: Mother/Baby  Anesthesia Type:Spinal  Level of Consciousness: awake  Airway and Oxygen Therapy: Patient Spontanous Breathing  Post-op Pain: mild  Post-op Assessment: Patient's Cardiovascular Status Stable and Respiratory Function Stable  Post-op Vital Signs: stable  Complications: No apparent anesthesia complications

## 2012-11-02 NOTE — Anesthesia Procedure Notes (Signed)
Spinal  Patient location during procedure: OR Start time: 11/02/2012 7:17 AM Staffing Anesthesiologist: Gustavus Haskin A. Performed by: anesthesiologist  Preanesthetic Checklist Completed: patient identified, site marked, surgical consent, pre-op evaluation, timeout performed, IV checked, risks and benefits discussed and monitors and equipment checked Spinal Block Patient position: sitting Prep: site prepped and draped and DuraPrep Patient monitoring: heart rate, cardiac monitor, continuous pulse ox and blood pressure Approach: midline Location: L3-4 Injection technique: single-shot Needle Needle type: Sprotte  Needle gauge: 24 G Needle length: 9 cm Needle insertion depth: 5 cm Assessment Sensory level: T4 Additional Notes Patient tolerated procedure well. Adequate sensory level.

## 2012-11-02 NOTE — Anesthesia Preprocedure Evaluation (Signed)
Anesthesia Evaluation  Patient identified by MRN, date of birth, ID band Patient awake    Reviewed: Allergy & Precautions, H&P , NPO status , Patient's Chart, lab work & pertinent test results  Airway Mallampati: II      Dental no notable dental hx.    Pulmonary neg pulmonary ROS, asthma ,  breath sounds clear to auscultation  Pulmonary exam normal       Cardiovascular Exercise Tolerance: Good negative cardio ROS  Rhythm:regular Rate:Normal     Neuro/Psych negative neurological ROS  negative psych ROS   GI/Hepatic negative GI ROS, Neg liver ROS, GERD-  ,  Endo/Other  negative endocrine ROS  Renal/GU negative Renal ROS  negative genitourinary   Musculoskeletal   Abdominal Normal abdominal exam  (+)   Peds  Hematology negative hematology ROS (+) anemia ,   Anesthesia Other Findings PONV (postoperative nausea and vomiting)   Vomited twice after circlage on 05/09/12 IBS (irritable bowel syndrome)        Preterm labor   last pregnancy- delivered at 26 weeks- healthy infant now Suspected exposure to mold   saw a pulmonologist summer 2013 for wheezing- resolved    Anemia   history in patient's 20's Asthma   well controlled per pt- no inhaler use in past year    GERD (gastroesophageal reflux disease)   mild - no meds - diet controlled    Reproductive/Obstetrics (+) Pregnancy                           Anesthesia Physical Anesthesia Plan  ASA: II  Anesthesia Plan: Spinal   Post-op Pain Management:    Induction:   Airway Management Planned:   Additional Equipment:   Intra-op Plan:   Post-operative Plan:   Informed Consent: I have reviewed the patients History and Physical, chart, labs and discussed the procedure including the risks, benefits and alternatives for the proposed anesthesia with the patient or authorized representative who has indicated his/her understanding and acceptance.      Plan Discussed with: Anesthesiologist, CRNA and Surgeon  Anesthesia Plan Comments:         Anesthesia Quick Evaluation

## 2012-11-02 NOTE — Op Note (Addendum)
Cesarean Section Procedure Note   Lisa Kelly  11/02/2012  Indications: Scheduled Proceedure/Maternal Request, h/o vertical extension with previous c-section, cerclage  Pre-operative Diagnosis: previous cesarean section cpt 59510.   Post-operative Diagnosis: Same   Procedure:  Repeat LTCD, cerclage removal  Surgeon: Surgeon(s) and Role:    * Zelphia Cairo, MD - Primary    * Mitchel Honour, DO - Assisting   Assistants: Mitchel Honour, MD  Anesthesia: spinal   Procedure Details:  The patient was seen in the Holding Room. The risks, benefits, complications, treatment options, and expected outcomes were discussed with the patient. The patient concurred with the proposed plan, giving informed consent. identified as Thomes Lolling and the procedure verified as C-Section Delivery. A Time Out was held and the above information confirmed.  After induction of anesthesia, the patient was draped and prepped in the usual sterile manner. A transverse was made and carried down through the subcutaneous tissue to the fascia. Fascial incision was made and extended transversely. The fascia was separated from the underlying rectus tissue superiorly and inferiorly. The peritoneum was identified and entered. Peritoneal incision was extended longitudinally. The utero-vesical peritoneal reflection was incised transversely and the bladder flap was bluntly freed from the lower uterine segment. A low transverse uterine incision was made. Delivered from cephalic presentation was a vigerous female infant with Apgar scores of 6 at one minute and 9 at five minutes. Infant was low in the pelvis and the incision was extended slightly vertically.  Dr Langston Masker was called in to help with delivery of the fetal vertex.  Cord ph was not sent the umbilical cord was clamped and cut cord blood was obtained for evaluation. The placenta was removed Intact and appeared normal. The uterine outline, tubes and ovaries appeared normal}. The  uterine incision was closed with running locked sutures of 0chromic gut.   Hemostasis was observed. Lavage was carried out until clear. The fascia was then reapproximated with running sutures of 0PDS.  The skin was closed with staples.   Pt was then placed in frog leg position, cerclage was grasped with forceps and the cerclage was removed intact with curved mayo sissors.  Instrument, sponge, and needle counts were correct prior the abdominal closure and were correct at the conclusion of the case.     Estimated Blood Loss: 500cc  Urine Output: clear  Specimens: placenta  Complications: no complications  Disposition: PACU - hemodynamically stable.   Maternal Condition: stable   Baby condition / location:  in OR with mom, skin to skin  Attending Attestation: I was present and scrubbed for the entire procedure.   Signed: Surgeon(s): Zelphia Cairo, MD Mitchel Honour, DO

## 2012-11-02 NOTE — Transfer of Care (Signed)
Immediate Anesthesia Transfer of Care Note  Patient: Lisa Kelly  Procedure(s) Performed: Procedure(s) with comments: REPEAT CESAREAN SECTION, removal of cervical cerclage (N/A) - repeat edc 11/06/12  Patient Location: PACU  Anesthesia Type:Spinal  Level of Consciousness: awake, alert  and oriented  Airway & Oxygen Therapy: Patient Spontanous Breathing  Post-op Assessment: Report given to PACU RN and Post -op Vital signs reviewed and stable  Post vital signs: Reviewed and stable  Complications: No apparent anesthesia complications

## 2012-11-02 NOTE — Preoperative (Signed)
Beta Blockers   Reason not to administer Beta Blockers:Not Applicable 

## 2012-11-02 NOTE — Progress Notes (Signed)
Plan of care reviewed with pt.  R/b/a discussed,  Informed consent

## 2012-11-03 ENCOUNTER — Encounter (HOSPITAL_COMMUNITY): Payer: Self-pay | Admitting: Obstetrics and Gynecology

## 2012-11-03 LAB — CBC
HCT: 35.8 % — ABNORMAL LOW (ref 36.0–46.0)
MCV: 92 fL (ref 78.0–100.0)
Platelets: 165 10*3/uL (ref 150–400)
RBC: 3.89 MIL/uL (ref 3.87–5.11)
WBC: 13.3 10*3/uL — ABNORMAL HIGH (ref 4.0–10.5)

## 2012-11-03 NOTE — Progress Notes (Signed)
Subjective: Postpartum Day 1: Cesarean Delivery Patient reports incisional pain, tolerating PO and no problems voiding.  Pt wants circ prior to d/c.  Objective: Vital signs in last 24 hours: Temp:  [97.4 F (36.3 C)-98.8 F (37.1 C)] 97.9 F (36.6 C) (06/26 0602) Pulse Rate:  [62-93] 66 (06/26 0602) Resp:  [16-20] 20 (06/26 0602) BP: (102-137)/(50-75) 115/63 mmHg (06/26 0602) SpO2:  [96 %-99 %] 97 % (06/25 2156) Weight:  [99.791 kg (220 lb)] 99.791 kg (220 lb) (06/25 0845)  Physical Exam:  General: alert, cooperative and appears stated age Lochia: appropriate Uterine Fundus: firm Incision: healing well, no significant drainage, no dehiscence.  Honeycomb dressing in place with scant drainage. DVT Evaluation: No evidence of DVT seen on physical exam. Negative Homan's sign. No cords or calf tenderness.   Recent Labs  10/31/12 1035 11/03/12 0658  HGB 12.7 12.3  HCT 36.8 35.8*    Assessment/Plan: Status post Cesarean section. Doing well postoperatively.  Continue current care. Counseled re: r/b/a for circ.  All questions were answered.  Will circ today.  Camron Essman 11/03/2012, 8:02 AM

## 2012-11-03 NOTE — Op Note (Signed)
Procedure: Newborn Female Circumcision using a Gomco  Indication: Parental request  EBL: Minimal  Complications: None immediate  Anesthesia: 1% lidocaine local, Tylenol  Procedure in detail:  A dorsal penile nerve block was performed with 1% lidocaine.  The area was then cleaned with betadine and draped in sterile fashion.  Two hemostats are applied at the 3 o'clock and 9 o'clock positions on the foreskin.  While maintaining traction, a third hemostat was used to sweep around the glans the release adhesions between the glans and the inner layer of mucosa avoiding the 5 o'clock and 7 o'clock positions.   The hemostat is then placed at the 12 o'clock position in the midline.  The hemostat is then removed and scissors are used to cut along the crushed skin to its most proximal point.   The foreskin is retracted over the glans removing any additional adhesions with blunt dissection or probe as needed.  The foreskin is then placed back over the glans and the  1.1  gomco bell is inserted over the glans.  The two hemostats are removed and one hemostat holds the foreskin and underlying mucosa.  The incision is guided above the base plate of the gomco.  The clamp is then attached and tightened until the foreskin is crushed between the bell and the base plate.  This is held in place for 5 minutes with excision of the foreskin atop the base plate with the scalpel.  The thumbscrew is then loosened, base plate removed and then bell removed with gentle traction.  The area was inspected and found to be hemostatic.  A 6.5 inch of gelfoam was then applied to the cut edge of the foreskin.    Allante Whitmire DO 11/03/2012 8:45 AM

## 2012-11-04 MED ORDER — OXYCODONE-ACETAMINOPHEN 5-325 MG PO TABS: 1.0000 | ORAL_TABLET | ORAL | Status: DC | PRN

## 2012-11-04 MED ORDER — IBUPROFEN 600 MG PO TABS: 600.0000 mg | ORAL_TABLET | Freq: Four times a day (QID) | ORAL | Status: DC | PRN

## 2012-11-04 NOTE — Discharge Summary (Signed)
Obstetric Discharge Summary Reason for Admission: cesarean section Prenatal Procedures: none Intrapartum Procedures: cesarean: low cervical, transverse Postpartum Procedures: none Complications-Operative and Postpartum: none Hemoglobin  Date Value Range Status  11/03/2012 12.3  12.0 - 15.0 g/dL Final     HCT  Date Value Range Status  11/03/2012 35.8* 36.0 - 46.0 % Final    Physical Exam:  General: alert, cooperative, appears stated age and no distress Lochia: appropriate Uterine Fundus: firm Incision: healing well DVT Evaluation: No evidence of DVT seen on physical exam.  Discharge Diagnoses: Term Pregnancy-delivered  Discharge Information: Date: 11/04/2012 Activity: pelvic rest Diet: routine Medications: Ibuprofen and Percocet Condition: stable Instructions: refer to practice specific booklet Discharge to: home   Newborn Data: Live born female  Birth Weight: 8 lb 12.4 oz (3980 g) APGAR: 6, 9  Home with mother.  Jhovani Griswold C 11/04/2012, 9:46 AM

## 2014-02-20 ENCOUNTER — Other Ambulatory Visit: Payer: Self-pay | Admitting: Obstetrics and Gynecology

## 2014-02-21 LAB — CYTOLOGY - PAP

## 2014-02-22 ENCOUNTER — Other Ambulatory Visit: Payer: Self-pay | Admitting: Obstetrics and Gynecology

## 2014-02-22 DIAGNOSIS — R928 Other abnormal and inconclusive findings on diagnostic imaging of breast: Secondary | ICD-10-CM

## 2014-03-09 ENCOUNTER — Ambulatory Visit
Admission: RE | Admit: 2014-03-09 | Discharge: 2014-03-09 | Disposition: A | Discharge status: Home / Self Care | Payer: BC Managed Care – PPO | Source: Ambulatory Visit | Attending: Obstetrics and Gynecology | Admitting: Obstetrics and Gynecology

## 2014-03-09 DIAGNOSIS — R928 Other abnormal and inconclusive findings on diagnostic imaging of breast: Secondary | ICD-10-CM

## 2014-03-12 ENCOUNTER — Encounter (HOSPITAL_COMMUNITY): Payer: Self-pay | Admitting: Obstetrics and Gynecology

## 2015-06-16 IMAGING — MG MM DIAGNOSTIC UNILATERAL R
3 series · 3 of 3 positions shown · non-contrast
Comparison: Baseline mammogram 02/20/2014

CLINICAL DATA: Patient recalled from screening for right breast
asymmetry.

EXAM:
DIGITAL DIAGNOSTIC  RIGHT MAMMOGRAM WITH CAD
ULTRASOUND RIGHT BREAST

[R MLO (1 of 2)]
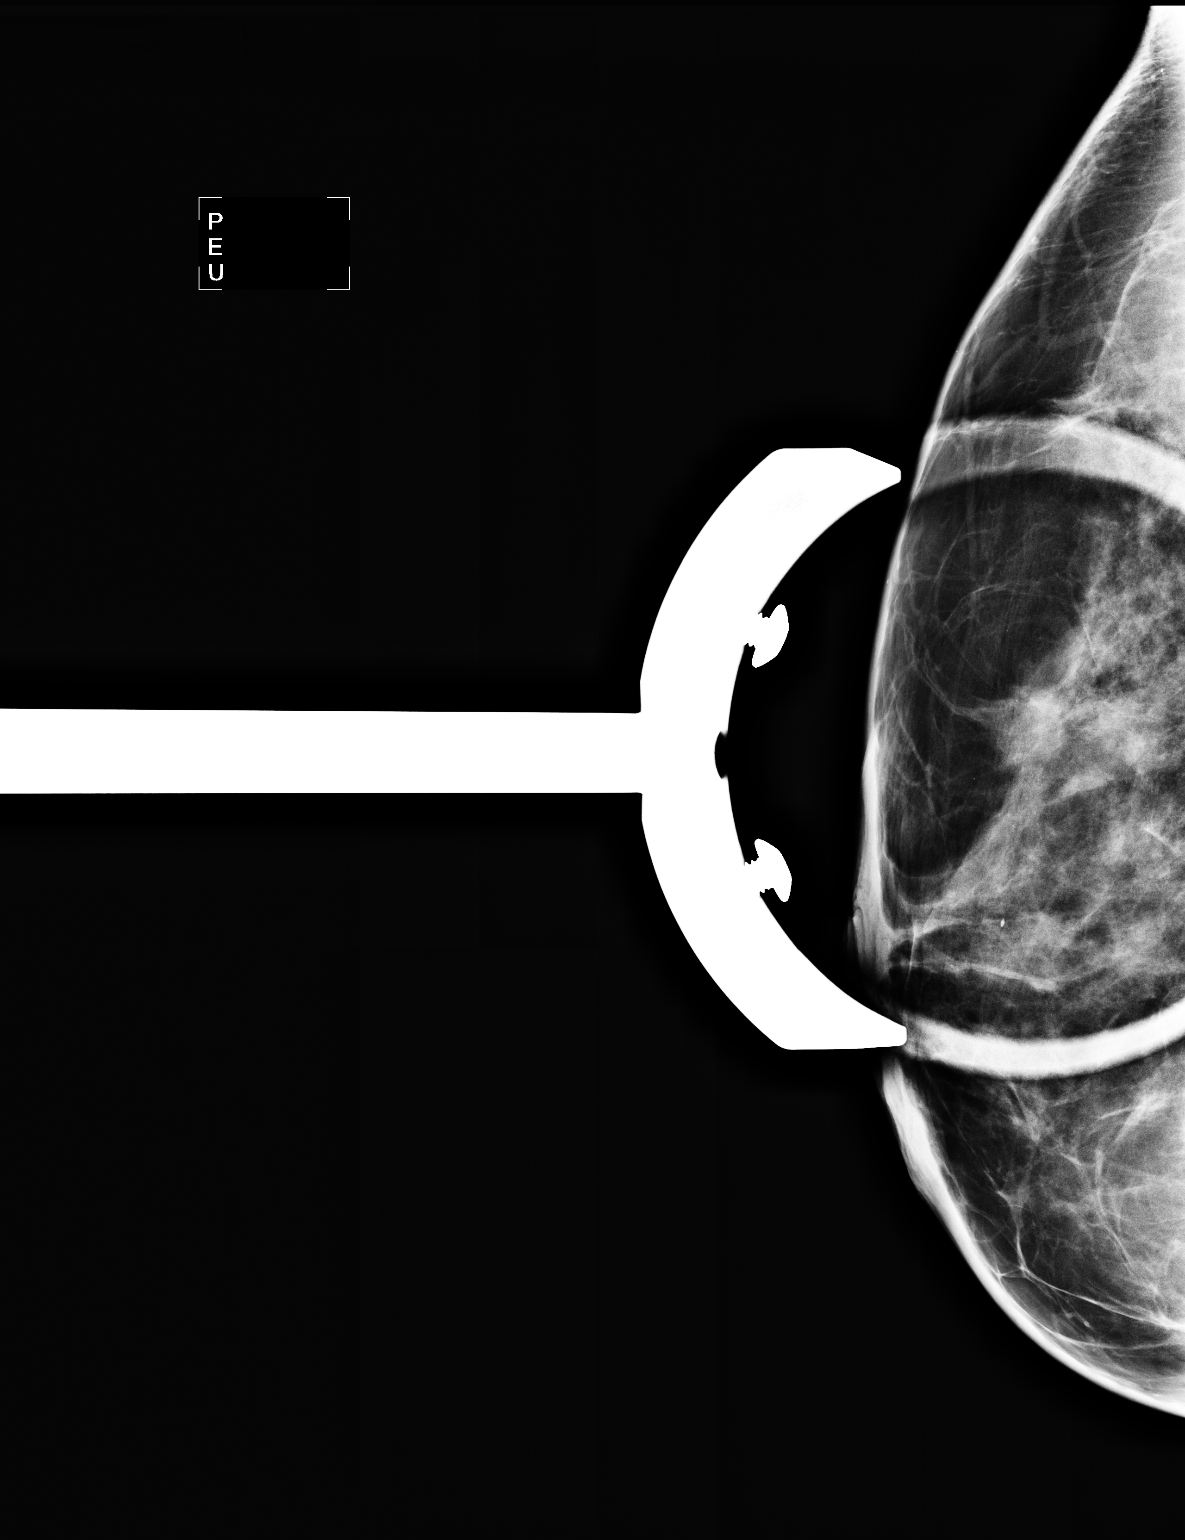

[R ML]
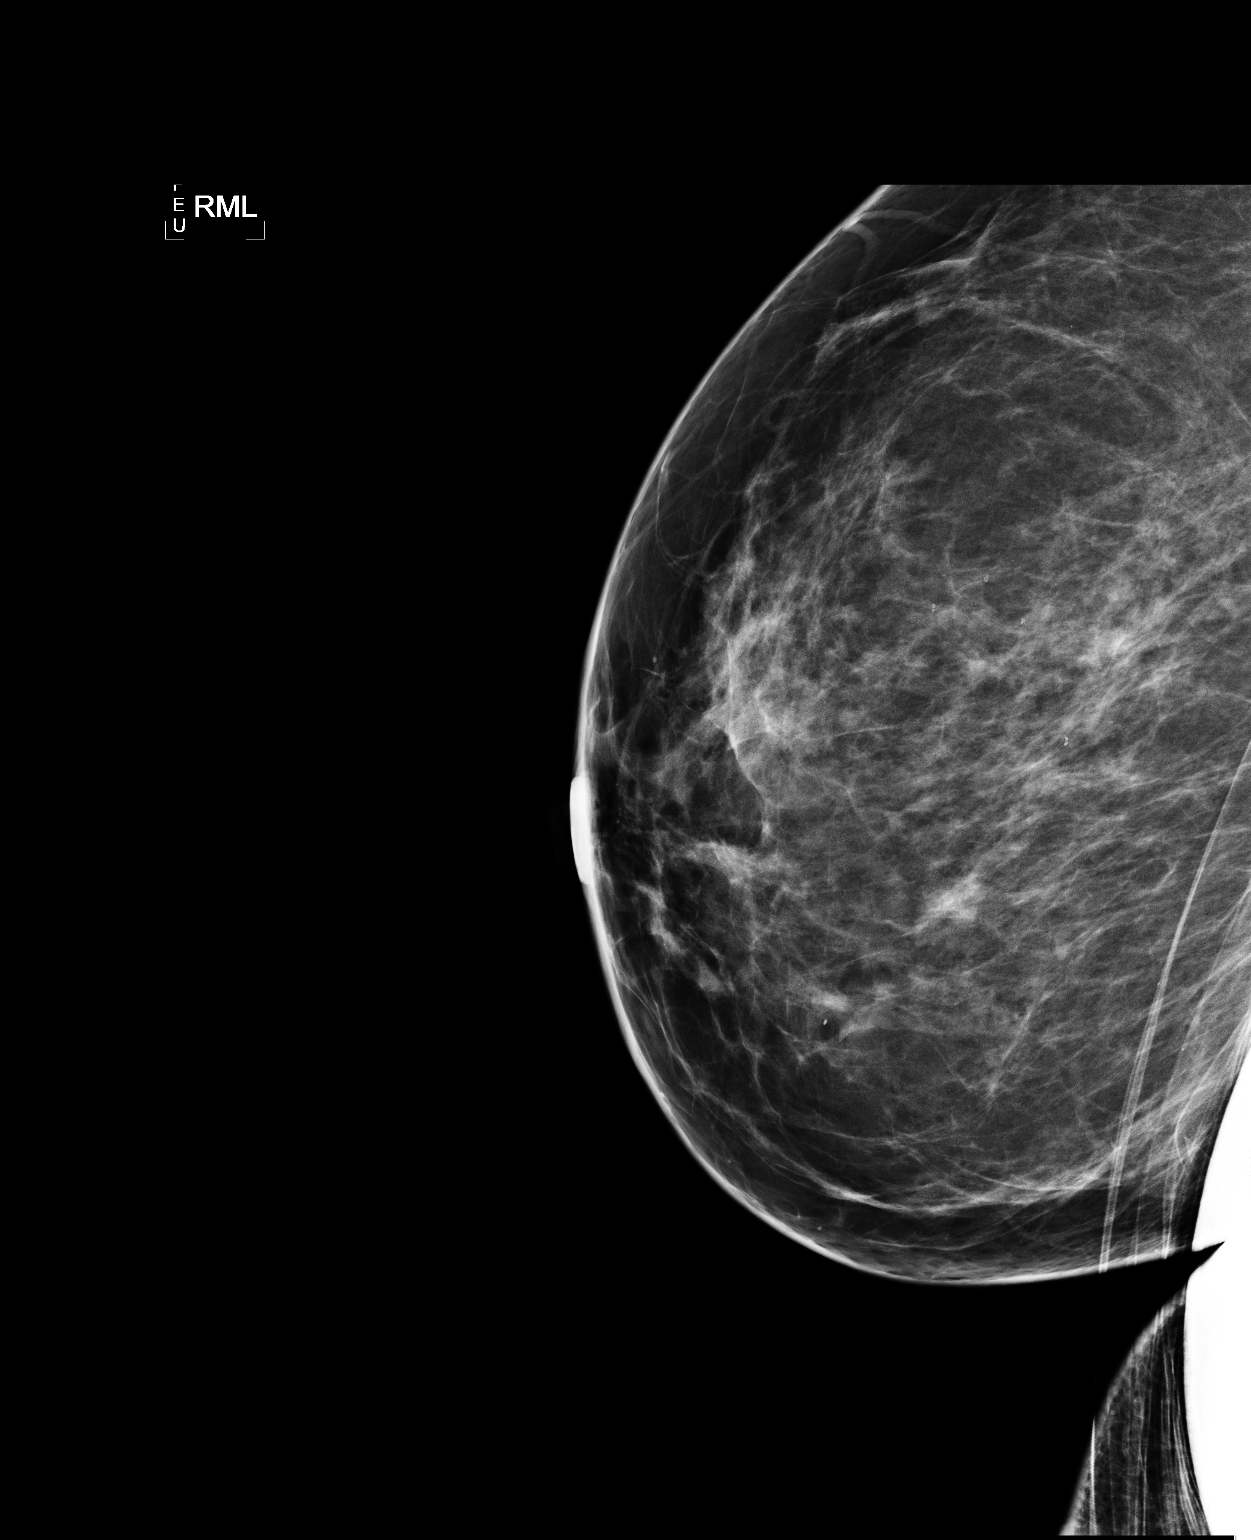

[R MLO (2 of 2)]
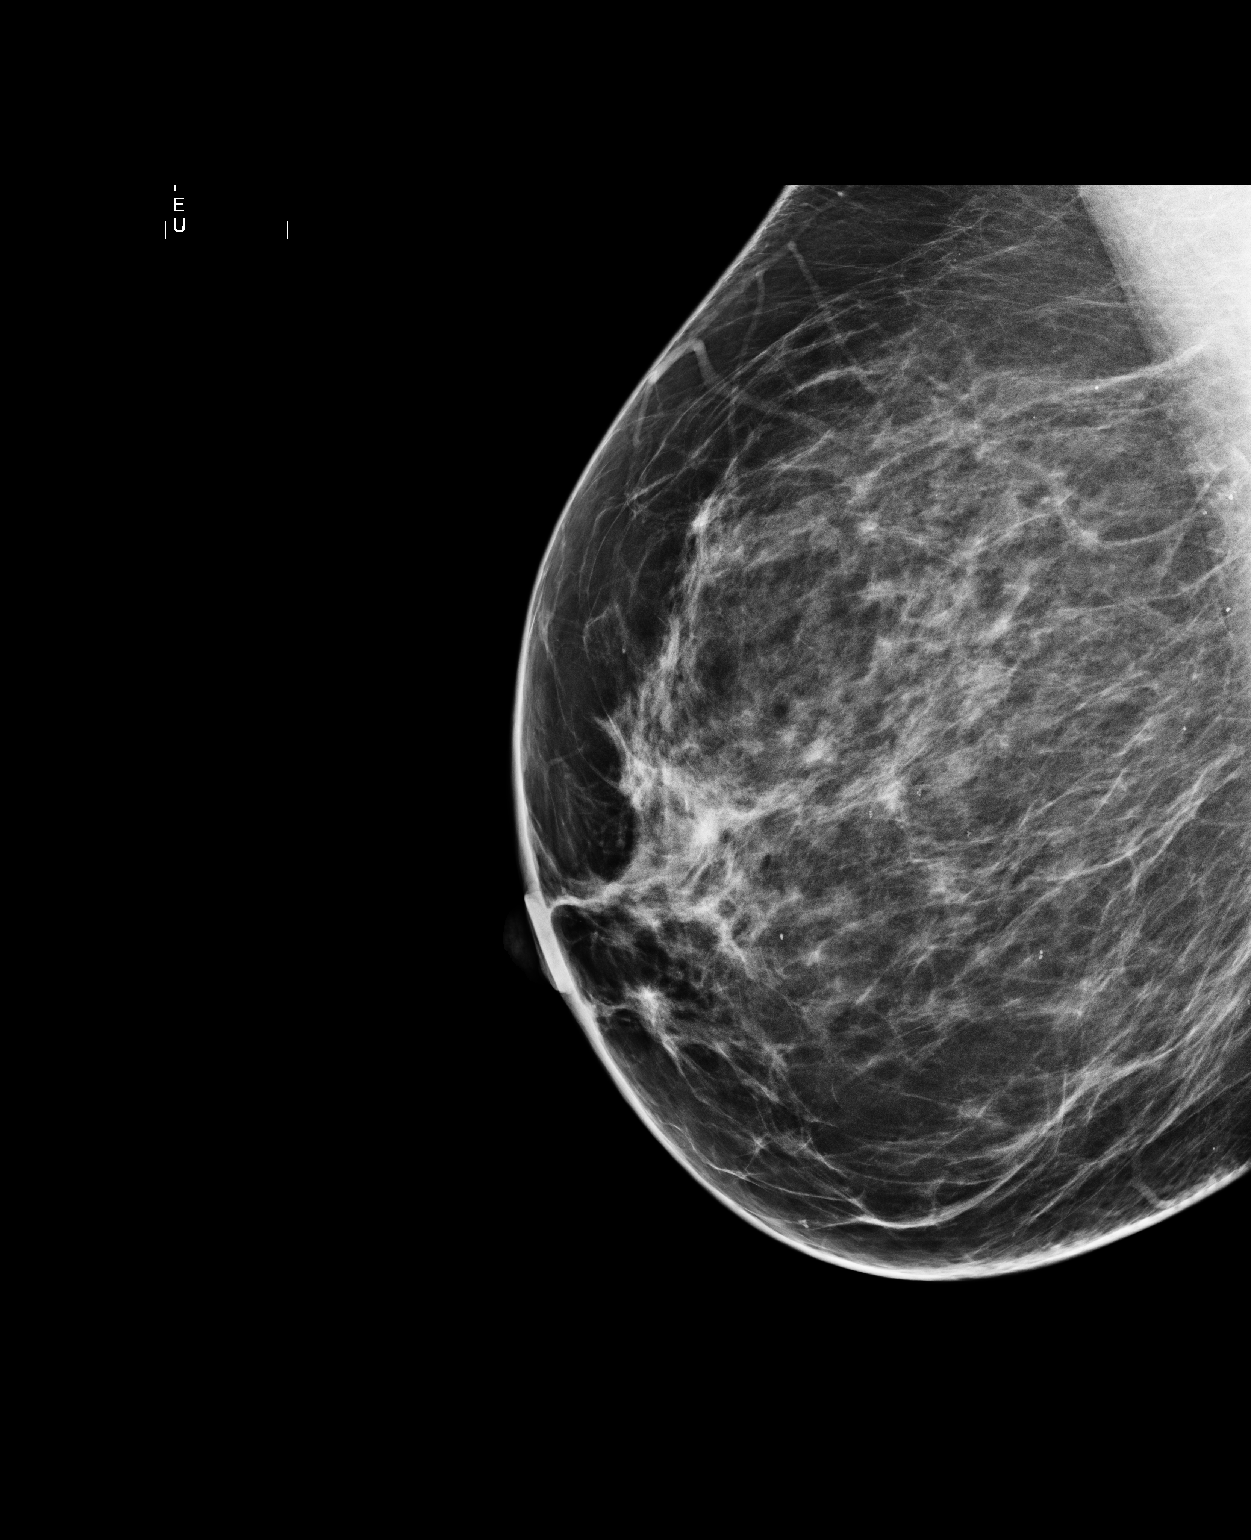

[3 of 3 positions shown; findings below may reference images not displayed]

ACR Breast Density Category b: There are scattered areas of
fibroglandular density.
FINDINGS: Questioned asymmetry within the superior aspect of the right breast
on the MLO view anterior depth resolved with additional imaging
including repeat MLO view, true lateral view and spot compression
MLO view.

Mammographic images were processed with CAD.

On physical exam, I palpate no discrete mass within the superior
aspect of the right breast anteriorly.

Ultrasound is performed, showing no concerning mass within the
superior aspect of the right breast anteriorly.
IMPRESSION: No mammographic evidence for malignancy.

Questioned asymmetry resolved with additional imaging, compatible
with overlapping fibroglandular breast tissue.

RECOMMENDATION:
Screening mammogram in one year.(Code:SM-6-E6V)

I have discussed the findings and recommendations with the patient.
Results were also provided in writing at the conclusion of the
visit. If applicable, a reminder letter will be sent to the patient
regarding the next appointment.

BI-RADS CATEGORY  1: Negative.

## 2016-03-30 ENCOUNTER — Other Ambulatory Visit: Payer: Self-pay | Admitting: Obstetrics and Gynecology

## 2016-03-30 DIAGNOSIS — R928 Other abnormal and inconclusive findings on diagnostic imaging of breast: Secondary | ICD-10-CM

## 2016-04-07 ENCOUNTER — Ambulatory Visit
Admission: RE | Admit: 2016-04-07 | Discharge: 2016-04-07 | Disposition: A | Discharge status: Home / Self Care | Payer: BLUE CROSS/BLUE SHIELD | Source: Ambulatory Visit | Attending: Obstetrics and Gynecology | Admitting: Obstetrics and Gynecology

## 2016-04-07 DIAGNOSIS — R928 Other abnormal and inconclusive findings on diagnostic imaging of breast: Secondary | ICD-10-CM

## 2017-05-10 ENCOUNTER — Ambulatory Visit
Admission: RE | Admit: 2017-05-10 | Discharge: 2017-05-10 | Disposition: A | Discharge status: Home / Self Care | Payer: BLUE CROSS/BLUE SHIELD | Source: Ambulatory Visit | Attending: Obstetrics and Gynecology | Admitting: Obstetrics and Gynecology

## 2017-05-10 ENCOUNTER — Other Ambulatory Visit: Payer: Self-pay | Admitting: Obstetrics and Gynecology

## 2017-05-10 DIAGNOSIS — R062 Wheezing: Secondary | ICD-10-CM

## 2017-05-10 DIAGNOSIS — R05 Cough: Secondary | ICD-10-CM

## 2017-05-10 DIAGNOSIS — R059 Cough, unspecified: Secondary | ICD-10-CM

## 2019-05-01 ENCOUNTER — Other Ambulatory Visit: Payer: Self-pay | Admitting: Obstetrics and Gynecology

## 2019-05-01 DIAGNOSIS — R928 Other abnormal and inconclusive findings on diagnostic imaging of breast: Secondary | ICD-10-CM

## 2019-05-08 ENCOUNTER — Ambulatory Visit
Admission: RE | Admit: 2019-05-08 | Discharge: 2019-05-08 | Disposition: A | Discharge status: Home / Self Care | Payer: Managed Care, Other (non HMO) | Source: Ambulatory Visit | Attending: Obstetrics and Gynecology | Admitting: Obstetrics and Gynecology

## 2019-05-08 ENCOUNTER — Ambulatory Visit
Admission: RE | Admit: 2019-05-08 | Discharge: 2019-05-08 | Disposition: A | Discharge status: Home / Self Care | Payer: BLUE CROSS/BLUE SHIELD | Source: Ambulatory Visit | Attending: Obstetrics and Gynecology | Admitting: Obstetrics and Gynecology

## 2019-05-08 ENCOUNTER — Other Ambulatory Visit: Payer: Self-pay

## 2019-05-08 DIAGNOSIS — R928 Other abnormal and inconclusive findings on diagnostic imaging of breast: Secondary | ICD-10-CM

## 2021-04-30 ENCOUNTER — Other Ambulatory Visit: Payer: Self-pay | Admitting: Urology

## 2021-05-14 ENCOUNTER — Encounter (HOSPITAL_BASED_OUTPATIENT_CLINIC_OR_DEPARTMENT_OTHER): Payer: Self-pay | Admitting: Urology

## 2021-05-14 ENCOUNTER — Other Ambulatory Visit: Payer: Self-pay

## 2021-05-14 NOTE — Progress Notes (Signed)
Spoke w/ via phone for pre-op interview---patient Lab needs dos----      UPT         Lab results------NA COVID test -----patient states asymptomatic no test needed. Arrive at -------05:30 05/20/21 NPO after MN NO Solid Food.  Clear liquids from MN until 04:30 Med rec completed. Medications to take morning of surgery -----Advair; Hold Advil and Aleve until after procedure. Diabetic medication -----NA Patient instructed no nail polish to be worn day of surgery. Patient instructed to bring photo id and insurance card day of surgery. Patient aware to have Driver (ride ) / caregiver    for 24 hours after surgery. Patient Special Instructions -----bring inhaler Pre-Op special Istructions -----NA Patient verbalized understanding of instructions that were given at this phone interview. Patient denies shortness of breath, chest pain, fever, cough at this phone interview.

## 2021-05-19 NOTE — H&P (View-Only) (Signed)
CC/HPI: cc: microscopic hematuria   12/05/20: 46 year old woman referred for microscopic hematuria by her OBGYN. Urinalysis shows 10-30 RBCs per high-powered field per outside records. Her urinalysis today shows 40-60 RBCs per high-powered field. She is not on her menstrual cycle today. She has no tobacco history and no family history of GU malignancy. She has never passed a kidney stone before. She has intermittent right back pain/upper quadrant pain that is worsened by lying down on that side. No gross hematuria or recurrent UTIs.   01/17/21: Here for cystoscopy. CT urogram was obtained which showed prominent right urothelial thickening with calcification near right UVJ unclear if in ureter or not. Patient continues to have microscopic hematuria. She had the right flank pain that resolved with drinking water.   04/24/21: 46 year old woman with a history of microscopic hematuria comes in with repeat CT scan. Cystoscopy was normal during evaluation for hematuria. Initial CT scan showed prominent right urothelial thickening with calcification near the right UVJ. Unclear if it is in the ureter or not. Patient had flank pain last week and feels like she passed something after that. She is feeling much better. Urinalysis still shows 10-20 RBCs per high-powered field. She has a 1 cm nonobstructing right renal calculus.     ALLERGIES: No Known Allergies    MEDICATIONS: Advair     GU PSH: Cystoscopy - 01/17/2021 Locm 300-399Mg /Ml Iodine,1Ml - 04/22/2021, 12/26/2020       PSH Notes: C-Section (2009 & 2014)  Loop Procedure (2019)    NON-GU PSH: None   GU PMH: Microscopic hematuria - 04/22/2021, - 01/17/2021, - 12/26/2020, - 12/05/2020    NON-GU PMH: Asthma    FAMILY HISTORY: 1 Daughter - Daughter 1 son - Son prostate cancer in father - Father   SOCIAL HISTORY: Marital Status: Married Preferred Language: English; Ethnicity: Not Hispanic Or Latino; Race: White Current Smoking Status: Patient has  never smoked.   Tobacco Use Assessment Completed: Used Tobacco in last 30 days? Does not use smokeless tobacco. Has never drank.  Drinks 3 caffeinated drinks per day.    REVIEW OF SYSTEMS:    GU Review Female:   Patient denies frequent urination, hard to postpone urination, burning /pain with urination, get up at night to urinate, leakage of urine, stream starts and stops, trouble starting your stream, have to strain to urinate, and being pregnant.  Gastrointestinal (Upper):   Patient denies nausea, vomiting, and indigestion/ heartburn.  Gastrointestinal (Lower):   Patient denies diarrhea and constipation.  Constitutional:   Patient denies fever, night sweats, weight loss, and fatigue.  Skin:   Patient denies skin rash/ lesion and itching.  Eyes:   Patient denies blurred vision and double vision.  Ears/ Nose/ Throat:   Patient denies sore throat and sinus problems.  Hematologic/Lymphatic:   Patient denies swollen glands and easy bruising.  Cardiovascular:   Patient denies leg swelling and chest pains.  Respiratory:   Patient denies cough and shortness of breath.  Endocrine:   Patient denies excessive thirst.  Musculoskeletal:   Patient denies back pain and joint pain.  Neurological:   Patient denies headaches and dizziness.  Psychologic:   Patient denies depression and anxiety.   VITAL SIGNS: None   MULTI-SYSTEM PHYSICAL EXAMINATION:    Constitutional: Well-nourished. No physical deformities. Normally developed. Good grooming.  Neck: Neck symmetrical, not swollen. Normal tracheal position.  Respiratory: No labored breathing, no use of accessory muscles.   Skin: No paleness, no jaundice, no cyanosis. No lesion, no ulcer, no  rash.  Neurologic / Psychiatric: Oriented to time, oriented to place, oriented to person. No depression, no anxiety, no agitation.  Gastrointestinal: No rigidity, non obese abdomen.   Eyes: Normal conjunctivae. Normal eyelids.  Ears, Nose, Mouth, and Throat: Left  ear no scars, no lesions, no masses. Right ear no scars, no lesions, no masses. Nose no scars, no lesions, no masses. Normal hearing. Normal lips.  Musculoskeletal: Normal gait and station of head and neck.     Complexity of Data:  Records Review:   Previous Patient Records, POC Tool  Urine Test Review:   Urinalysis  X-Ray Review: C.T. Abdomen/Pelvis: Reviewed Films. Reviewed Report. Discussed With Patient. IMPRESSION: 1. Nonobstructive 10 mm right interpolar renal stone. No ureteral or bladder calculi identified. No hydronephrosis. 2. No solid enhancing renal mass. 3. Colonic diverticulosis without findings of acute diverticulitis. 4. Aortic Atherosclerosis (ICD10-I70.0). Electronically Signed By: Dahlia Bailiff M.D. On: 04/23/2021 11:37   Notes:                     09/30/2020: BUN 15, creatinine 0.83   PROCEDURES:          Urinalysis w/Scope Dipstick Dipstick Cont'd Micro  Color: Yellow Bilirubin: Neg mg/dL WBC/hpf: NS (Not Seen)  Appearance: Slightly Cloudy Ketones: Neg mg/dL RBC/hpf: 10 - 20/hpf  Specific Gravity: 1.020 Blood: 2+ ery/uL Bacteria: Few (10-25/hpf)  pH: 6.0 Protein: 1+ mg/dL Cystals: NS (Not Seen)  Glucose: Neg mg/dL Urobilinogen: 0.2 mg/dL Casts: NS (Not Seen)    Nitrites: Neg Trichomonas: Not Present    Leukocyte Esterase: Neg leu/uL Mucous: Not Present      Epithelial Cells: 0 - 5/hpf      Yeast: NS (Not Seen)      Sperm: Not Present    ASSESSMENT:      ICD-10 Details  1 GU:   Microscopic hematuria - R31.21 Chronic, Stable  2   Renal calculus - N20.0 Chronic, Stable   PLAN:           Document Letter(s):  Created for Patient: Clinical Summary         Notes:   1. Renal calculus: Patient with 1 cm renal stone that will likely not pass on its own based on size. I did discuss ESWL versus ureteroscopy with laser lithotripsy. Given persistent microscopic hematuria we discussed choosing the ureteroscopy to fully examine ureter and collecting system. Repeat CT scan  does not show prominent urothelial thickening anymore. There is no enhancement. Risks and benefits of ureteroscopy were discussed with the patient in detail including but not limited to bleeding, infection, pain, stent discomfort, need for prolonged stent, demonstrating structures including kidney/ureter/bladder/urethra/other, need for additional procedures, need for staged procedure, and later remove stone. Patient understands these risks and wishes to schedule surgery after the holidays.

## 2021-05-19 NOTE — H&P (Signed)
CC/HPI: cc: microscopic hematuria   12/05/20: 46 year old woman referred for microscopic hematuria by her OBGYN. Urinalysis shows 10-30 RBCs per high-powered field per outside records. Her urinalysis today shows 40-60 RBCs per high-powered field. She is not on her menstrual cycle today. She has no tobacco history and no family history of GU malignancy. She has never passed a kidney stone before. She has intermittent right back pain/upper quadrant pain that is worsened by lying down on that side. No gross hematuria or recurrent UTIs.   01/17/21: Here for cystoscopy. CT urogram was obtained which showed prominent right urothelial thickening with calcification near right UVJ unclear if in ureter or not. Patient continues to have microscopic hematuria. She had the right flank pain that resolved with drinking water.   04/24/21: 46 year old woman with a history of microscopic hematuria comes in with repeat CT scan. Cystoscopy was normal during evaluation for hematuria. Initial CT scan showed prominent right urothelial thickening with calcification near the right UVJ. Unclear if it is in the ureter or not. Patient had flank pain last week and feels like she passed something after that. She is feeling much better. Urinalysis still shows 10-20 RBCs per high-powered field. She has a 1 cm nonobstructing right renal calculus.     ALLERGIES: No Known Allergies    MEDICATIONS: Advair     GU PSH: Cystoscopy - 01/17/2021 Locm 300-399Mg /Ml Iodine,1Ml - 04/22/2021, 12/26/2020       PSH Notes: C-Section (2009 & 2014)  Loop Procedure (2019)    NON-GU PSH: None   GU PMH: Microscopic hematuria - 04/22/2021, - 01/17/2021, - 12/26/2020, - 12/05/2020    NON-GU PMH: Asthma    FAMILY HISTORY: 1 Daughter - Daughter 1 son - Son prostate cancer in father - Father   SOCIAL HISTORY: Marital Status: Married Preferred Language: English; Ethnicity: Not Hispanic Or Latino; Race: White Current Smoking Status: Patient has  never smoked.   Tobacco Use Assessment Completed: Used Tobacco in last 30 days? Does not use smokeless tobacco. Has never drank.  Drinks 3 caffeinated drinks per day.    REVIEW OF SYSTEMS:    GU Review Female:   Patient denies frequent urination, hard to postpone urination, burning /pain with urination, get up at night to urinate, leakage of urine, stream starts and stops, trouble starting your stream, have to strain to urinate, and being pregnant.  Gastrointestinal (Upper):   Patient denies nausea, vomiting, and indigestion/ heartburn.  Gastrointestinal (Lower):   Patient denies diarrhea and constipation.  Constitutional:   Patient denies fever, night sweats, weight loss, and fatigue.  Skin:   Patient denies skin rash/ lesion and itching.  Eyes:   Patient denies blurred vision and double vision.  Ears/ Nose/ Throat:   Patient denies sore throat and sinus problems.  Hematologic/Lymphatic:   Patient denies swollen glands and easy bruising.  Cardiovascular:   Patient denies leg swelling and chest pains.  Respiratory:   Patient denies cough and shortness of breath.  Endocrine:   Patient denies excessive thirst.  Musculoskeletal:   Patient denies back pain and joint pain.  Neurological:   Patient denies headaches and dizziness.  Psychologic:   Patient denies depression and anxiety.   VITAL SIGNS: None   MULTI-SYSTEM PHYSICAL EXAMINATION:    Constitutional: Well-nourished. No physical deformities. Normally developed. Good grooming.  Neck: Neck symmetrical, not swollen. Normal tracheal position.  Respiratory: No labored breathing, no use of accessory muscles.   Skin: No paleness, no jaundice, no cyanosis. No lesion, no ulcer, no  rash.  Neurologic / Psychiatric: Oriented to time, oriented to place, oriented to person. No depression, no anxiety, no agitation.  Gastrointestinal: No rigidity, non obese abdomen.   Eyes: Normal conjunctivae. Normal eyelids.  Ears, Nose, Mouth, and Throat: Left  ear no scars, no lesions, no masses. Right ear no scars, no lesions, no masses. Nose no scars, no lesions, no masses. Normal hearing. Normal lips.  Musculoskeletal: Normal gait and station of head and neck.     Complexity of Data:  Records Review:   Previous Patient Records, POC Tool  Urine Test Review:   Urinalysis  X-Ray Review: C.T. Abdomen/Pelvis: Reviewed Films. Reviewed Report. Discussed With Patient. IMPRESSION: 1. Nonobstructive 10 mm right interpolar renal stone. No ureteral or bladder calculi identified. No hydronephrosis. 2. No solid enhancing renal mass. 3. Colonic diverticulosis without findings of acute diverticulitis. 4. Aortic Atherosclerosis (ICD10-I70.0). Electronically Signed By: Dahlia Bailiff M.D. On: 04/23/2021 11:37   Notes:                     09/30/2020: BUN 15, creatinine 0.83   PROCEDURES:          Urinalysis w/Scope Dipstick Dipstick Cont'd Micro  Color: Yellow Bilirubin: Neg mg/dL WBC/hpf: NS (Not Seen)  Appearance: Slightly Cloudy Ketones: Neg mg/dL RBC/hpf: 10 - 20/hpf  Specific Gravity: 1.020 Blood: 2+ ery/uL Bacteria: Few (10-25/hpf)  pH: 6.0 Protein: 1+ mg/dL Cystals: NS (Not Seen)  Glucose: Neg mg/dL Urobilinogen: 0.2 mg/dL Casts: NS (Not Seen)    Nitrites: Neg Trichomonas: Not Present    Leukocyte Esterase: Neg leu/uL Mucous: Not Present      Epithelial Cells: 0 - 5/hpf      Yeast: NS (Not Seen)      Sperm: Not Present    ASSESSMENT:      ICD-10 Details  1 GU:   Microscopic hematuria - R31.21 Chronic, Stable  2   Renal calculus - N20.0 Chronic, Stable   PLAN:           Document Letter(s):  Created for Patient: Clinical Summary         Notes:   1. Renal calculus: Patient with 1 cm renal stone that will likely not pass on its own based on size. I did discuss ESWL versus ureteroscopy with laser lithotripsy. Given persistent microscopic hematuria we discussed choosing the ureteroscopy to fully examine ureter and collecting system. Repeat CT scan  does not show prominent urothelial thickening anymore. There is no enhancement. Risks and benefits of ureteroscopy were discussed with the patient in detail including but not limited to bleeding, infection, pain, stent discomfort, need for prolonged stent, demonstrating structures including kidney/ureter/bladder/urethra/other, need for additional procedures, need for staged procedure, and later remove stone. Patient understands these risks and wishes to schedule surgery after the holidays.

## 2021-05-20 ENCOUNTER — Ambulatory Visit (HOSPITAL_BASED_OUTPATIENT_CLINIC_OR_DEPARTMENT_OTHER): Payer: Managed Care, Other (non HMO) | Admitting: Certified Registered"

## 2021-05-20 ENCOUNTER — Encounter (HOSPITAL_BASED_OUTPATIENT_CLINIC_OR_DEPARTMENT_OTHER)
Admission: RE | Disposition: A | Discharge status: Home / Self Care | Payer: Self-pay | Source: Home / Self Care | Attending: Urology

## 2021-05-20 ENCOUNTER — Encounter (HOSPITAL_BASED_OUTPATIENT_CLINIC_OR_DEPARTMENT_OTHER): Payer: Self-pay | Admitting: Urology

## 2021-05-20 ENCOUNTER — Other Ambulatory Visit: Payer: Self-pay

## 2021-05-20 ENCOUNTER — Ambulatory Visit (HOSPITAL_BASED_OUTPATIENT_CLINIC_OR_DEPARTMENT_OTHER)
Admission: RE | Admit: 2021-05-20 | Discharge: 2021-05-20 | Disposition: A | Discharge status: Home / Self Care | Payer: Managed Care, Other (non HMO) | Attending: Urology | Admitting: Urology

## 2021-05-20 DIAGNOSIS — N202 Calculus of kidney with calculus of ureter: Secondary | ICD-10-CM | POA: Insufficient documentation

## 2021-05-20 DIAGNOSIS — K219 Gastro-esophageal reflux disease without esophagitis: Secondary | ICD-10-CM | POA: Diagnosis not present

## 2021-05-20 DIAGNOSIS — D649 Anemia, unspecified: Secondary | ICD-10-CM | POA: Insufficient documentation

## 2021-05-20 HISTORY — PX: CYSTOSCOPY WITH RETROGRADE PYELOGRAM, URETEROSCOPY AND STENT PLACEMENT: SHX5789

## 2021-05-20 LAB — POCT PREGNANCY, URINE: Preg Test, Ur: NEGATIVE

## 2021-05-20 SURGERY — CYSTOURETEROSCOPY, WITH RETROGRADE PYELOGRAM AND STENT INSERTION
Anesthesia: General | Site: Urethra | Laterality: Right

## 2021-05-20 MED ORDER — DEXAMETHASONE SODIUM PHOSPHATE 10 MG/ML IJ SOLN
INTRAMUSCULAR | Status: DC | PRN
Start: 2021-05-20 — End: 2021-05-20
  Administered 2021-05-20: 10 mg via INTRAVENOUS

## 2021-05-20 MED ORDER — ACETAMINOPHEN 10 MG/ML IV SOLN
INTRAVENOUS | Status: AC
  Filled 2021-05-20: qty 100

## 2021-05-20 MED ORDER — LIDOCAINE 2% (20 MG/ML) 5 ML SYRINGE
INTRAMUSCULAR | Status: AC
  Filled 2021-05-20: qty 5

## 2021-05-20 MED ORDER — ONDANSETRON HCL 4 MG/2ML IJ SOLN
INTRAMUSCULAR | Status: DC | PRN
Start: 2021-05-20 — End: 2021-05-20
  Administered 2021-05-20: 4 mg via INTRAVENOUS

## 2021-05-20 MED ORDER — IOHEXOL 300 MG/ML  SOLN
INTRAMUSCULAR | Status: DC | PRN
  Administered 2021-05-20: 7 mL via URETHRAL

## 2021-05-20 MED ORDER — LACTATED RINGERS IV SOLN: INTRAVENOUS | Status: DC

## 2021-05-20 MED ORDER — SODIUM CHLORIDE 0.9 % IR SOLN
Status: DC | PRN
  Administered 2021-05-20: 3000 mL

## 2021-05-20 MED ORDER — FENTANYL CITRATE (PF) 100 MCG/2ML IJ SOLN: 25.0000 ug | INTRAMUSCULAR | Status: DC | PRN

## 2021-05-20 MED ORDER — OXYBUTYNIN CHLORIDE 5 MG PO TABS
5.0000 mg | ORAL_TABLET | Freq: Three times a day (TID) | ORAL | 0 refills | Status: AC | PRN
Start: 2021-05-20 — End: ?

## 2021-05-20 MED ORDER — TRAMADOL HCL 50 MG PO TABS
50.0000 mg | ORAL_TABLET | Freq: Four times a day (QID) | ORAL | 0 refills | Status: AC | PRN
Start: 2021-05-20 — End: 2022-05-20

## 2021-05-20 MED ORDER — 0.9 % SODIUM CHLORIDE (POUR BTL) OPTIME
TOPICAL | Status: DC | PRN
Start: 2021-05-20 — End: 2021-05-20
  Administered 2021-05-20: 500 mL

## 2021-05-20 MED ORDER — DEXAMETHASONE SODIUM PHOSPHATE 10 MG/ML IJ SOLN
INTRAMUSCULAR | Status: AC
  Filled 2021-05-20: qty 1

## 2021-05-20 MED ORDER — ACETAMINOPHEN 160 MG/5ML PO SOLN: 1000.0000 mg | Freq: Once | ORAL | Status: DC | PRN

## 2021-05-20 MED ORDER — ACETAMINOPHEN 500 MG PO TABS: 1000.0000 mg | ORAL_TABLET | Freq: Once | ORAL | Status: DC

## 2021-05-20 MED ORDER — FENTANYL CITRATE (PF) 100 MCG/2ML IJ SOLN
INTRAMUSCULAR | Status: DC | PRN
  Administered 2021-05-20 (×4): 25 ug via INTRAVENOUS

## 2021-05-20 MED ORDER — PROPOFOL 10 MG/ML IV BOLUS
INTRAVENOUS | Status: AC
  Filled 2021-05-20: qty 20

## 2021-05-20 MED ORDER — ACETAMINOPHEN 10 MG/ML IV SOLN: 1000.0000 mg | Freq: Once | INTRAVENOUS | Status: DC | PRN

## 2021-05-20 MED ORDER — CEFAZOLIN SODIUM-DEXTROSE 2-4 GM/100ML-% IV SOLN
INTRAVENOUS | Status: AC
  Filled 2021-05-20: qty 100

## 2021-05-20 MED ORDER — MIDAZOLAM HCL 2 MG/2ML IJ SOLN
INTRAMUSCULAR | Status: AC
  Filled 2021-05-20: qty 2

## 2021-05-20 MED ORDER — ACETAMINOPHEN 500 MG PO TABS: 1000.0000 mg | ORAL_TABLET | Freq: Once | ORAL | Status: DC | PRN

## 2021-05-20 MED ORDER — FENTANYL CITRATE (PF) 100 MCG/2ML IJ SOLN
INTRAMUSCULAR | Status: AC
  Filled 2021-05-20: qty 2

## 2021-05-20 MED ORDER — PROPOFOL 10 MG/ML IV BOLUS
INTRAVENOUS | Status: DC | PRN
  Administered 2021-05-20: 200 mg via INTRAVENOUS

## 2021-05-20 MED ORDER — ONDANSETRON HCL 4 MG/2ML IJ SOLN
INTRAMUSCULAR | Status: AC
  Filled 2021-05-20: qty 2

## 2021-05-20 MED ORDER — CEFAZOLIN SODIUM-DEXTROSE 2-4 GM/100ML-% IV SOLN
2.0000 g | INTRAVENOUS | Status: AC
  Administered 2021-05-20: 2 g via INTRAVENOUS

## 2021-05-20 MED ORDER — LIDOCAINE 2% (20 MG/ML) 5 ML SYRINGE
INTRAMUSCULAR | Status: DC | PRN
  Administered 2021-05-20: 60 mg via INTRAVENOUS

## 2021-05-20 MED ORDER — ACETAMINOPHEN 500 MG PO TABS
ORAL_TABLET | ORAL | Status: AC
  Filled 2021-05-20: qty 2

## 2021-05-20 MED ORDER — CEPHALEXIN 250 MG PO CAPS: 250.0000 mg | ORAL_CAPSULE | Freq: Every day | ORAL | 0 refills | Status: AC

## 2021-05-20 MED ORDER — ACETAMINOPHEN 10 MG/ML IV SOLN
INTRAVENOUS | Status: DC | PRN
Start: 2021-05-20 — End: 2021-05-20
  Administered 2021-05-20: 1000 mg via INTRAVENOUS

## 2021-05-20 MED ORDER — MIDAZOLAM HCL 5 MG/5ML IJ SOLN
INTRAMUSCULAR | Status: DC | PRN
  Administered 2021-05-20: 2 mg via INTRAVENOUS

## 2021-05-20 MED ORDER — TAMSULOSIN HCL 0.4 MG PO CAPS: 0.4000 mg | ORAL_CAPSULE | Freq: Every day | ORAL | 0 refills | Status: AC

## 2021-05-20 SURGICAL SUPPLY — 18 items
BAG DRAIN URO-CYSTO SKYTR STRL (DRAIN) ×3 IMPLANT
BAG DRN UROCATH (DRAIN) ×2
CATH URET 5FR 28IN OPEN ENDED (CATHETERS) ×3 IMPLANT
CLOTH BEACON ORANGE TIMEOUT ST (SAFETY) ×3 IMPLANT
GLOVE SURG ENC MOIS LTX SZ6.5 (GLOVE) ×3 IMPLANT
GLOVE SURG NEOPR MICRO LF SZ6 (GLOVE) ×2 IMPLANT
GLOVE SURG UNDER POLY LF SZ6 (GLOVE) ×2 IMPLANT
GOWN STRL REUS W/TWL LRG LVL3 (GOWN DISPOSABLE) ×5 IMPLANT
GUIDEWIRE STR DUAL SENSOR (WIRE) ×5 IMPLANT
IV NS IRRIG 3000ML ARTHROMATIC (IV SOLUTION) ×3 IMPLANT
KIT TURNOVER CYSTO (KITS) ×3 IMPLANT
MANIFOLD NEPTUNE II (INSTRUMENTS) ×3 IMPLANT
NS IRRIG 500ML POUR BTL (IV SOLUTION) ×2 IMPLANT
PACK CYSTO (CUSTOM PROCEDURE TRAY) ×3 IMPLANT
SHEATH URETERAL 12FRX28CM (UROLOGICAL SUPPLIES) ×2 IMPLANT
STENT URET 6FRX26 CONTOUR (STENTS) ×2 IMPLANT
TUBE CONNECTING 12X1/4 (SUCTIONS) ×3 IMPLANT
TUBING UROLOGY SET (TUBING) ×3 IMPLANT

## 2021-05-20 NOTE — Anesthesia Procedure Notes (Signed)
Procedure Name: LMA Insertion Date/Time: 05/20/2021 7:32 AM Performed by: Myna Bright, CRNA Pre-anesthesia Checklist: Patient identified, Emergency Drugs available, Suction available and Patient being monitored Patient Re-evaluated:Patient Re-evaluated prior to induction Oxygen Delivery Method: Circle system utilized Preoxygenation: Pre-oxygenation with 100% oxygen Induction Type: IV induction Ventilation: Mask ventilation without difficulty LMA: LMA inserted LMA Size: 4.0 Tube type: Oral Number of attempts: 1 Placement Confirmation: positive ETCO2 and breath sounds checked- equal and bilateral Tube secured with: Tape Dental Injury: Teeth and Oropharynx as per pre-operative assessment

## 2021-05-20 NOTE — Anesthesia Postprocedure Evaluation (Signed)
Anesthesia Post Note  Patient: Lisa Kelly  Procedure(s) Performed: CYSTOSCOPY WITH RETROGRADE PYELOGRAM, URETEROSCOPY AND STENT PLACEMENT (Right: Urethra)     Patient location during evaluation: PACU Anesthesia Type: General Level of consciousness: awake and alert Pain management: pain level controlled Vital Signs Assessment: post-procedure vital signs reviewed and stable Respiratory status: spontaneous breathing, nonlabored ventilation, respiratory function stable and patient connected to nasal cannula oxygen Cardiovascular status: blood pressure returned to baseline and stable Postop Assessment: no apparent nausea or vomiting Anesthetic complications: no   No notable events documented.  Last Vitals:  Vitals:   05/20/21 0830 05/20/21 0907  BP: 136/78 (!) 144/88  Pulse: 69 69  Resp: 15 14  Temp: (!) 36.3 C 36.4 C  SpO2: 98% 100%    Last Pain:  Vitals:   05/20/21 0907  TempSrc:   PainSc: 0-No pain                 Katilynn Sinkler

## 2021-05-20 NOTE — Interval H&P Note (Signed)
History and Physical Interval Note:  05/20/2021 7:17 AM  Lisa Kelly  has presented today for surgery, with the diagnosis of RIGHT RENAL CALCULUS.  The various methods of treatment have been discussed with the patient and family. After consideration of risks, benefits and other options for treatment, the patient has consented to  Procedure(s): CYSTOSCOPY WITH RETROGRADE PYELOGRAM, URETEROSCOPY AND STENT PLACEMENT (Right) HOLMIUM LASER APPLICATION (Right) as a surgical intervention.  The patient's history has been reviewed, patient examined, no change in status, stable for surgery.  I have reviewed the patient's chart and labs.  Questions were answered to the patient's satisfaction.     Eila Runyan D Khyson Sebesta

## 2021-05-20 NOTE — Discharge Instructions (Addendum)
No acetaminophen/Tylenol until after 1:45pm today if needed for pain.     DISCHARGE INSTRUCTIONS FOR KIDNEY STONE/URETERAL STENT   MEDICATIONS:  1. Resume all your other meds from home  2. AZO over the counter can help with the burning/stinging when you urinate. 3. Tramadol is for moderate/severe pain, otherwise taking up to 1000 mg every 6 hours of plainTylenol will help treat your pain.   4. Tamsulosin can help with stent discomfort 5. Oxybutynin can help with overactive bladder symptoms    ACTIVITY:  1. No strenuous activity x 1week  2. No driving while on narcotic pain medications  3. Drink plenty of water  4. Continue to walk at home - you can still get blood clots when you are at home, so keep active, but don't over do it.  5. May return to work/school tomorrow or when you feel ready   BATHING:  1. You can shower any time  SIGNS/SYMPTOMS TO CALL:  Please call us if you have a fever greater than 101.5, uncontrolled nausea/vomiting, uncontrolled pain, dizziness, unable to urinate, chest pain, shortness of breath, leg swelling, leg pain, redness around wound, drainage from wound, or any other concerns or questions.   You can reach [place] at [phone].   FOLLOW-UP:  1. You will be contacted with your next surgery date    05/20/21 To Whom It May Concern,  Please excuse Lisa Kelly from work 05/20/21 - 05/22/21 while she is under my clinical care. She may return to work on 05/23/21.   Sincerely, Kasandra Knudsen, MD [phone]       Post Anesthesia Home Care Instructions  Activity: Get plenty of rest for the remainder of the day. A responsible individual must stay with you for 24 hours following the procedure.  For the next 24 hours, DO NOT: -Drive a car -Advertising copywriter -Drink alcoholic beverages -Take any medication unless instructed by your physician -Make any legal decisions or sign important papers.  Meals: Start with liquid foods such as gelatin or  soup. Progress to regular foods as tolerated. Avoid greasy, spicy, heavy foods. If nausea and/or vomiting occur, drink only clear liquids until the nausea and/or vomiting subsides. Call your physician if vomiting continues.  Special Instructions/Symptoms: Your throat may feel dry or sore from the anesthesia or the breathing tube placed in your throat during surgery. If this causes discomfort, gargle with warm salt water. The discomfort should disappear within 24 hours.

## 2021-05-20 NOTE — Anesthesia Preprocedure Evaluation (Addendum)
Anesthesia Evaluation  Patient identified by MRN, date of birth, ID band Patient awake    Reviewed: Allergy & Precautions, NPO status , Patient's Chart, lab work & pertinent test results  History of Anesthesia Complications (+) PONV and history of anesthetic complications  Airway Mallampati: II  TM Distance: >3 FB Neck ROM: Full    Dental  (+) Teeth Intact, Dental Advisory Given   Pulmonary neg shortness of breath, asthma , neg sleep apnea, neg COPD, neg recent URI,    breath sounds clear to auscultation       Cardiovascular negative cardio ROS   Rhythm:Regular     Neuro/Psych negative neurological ROS  negative psych ROS   GI/Hepatic Neg liver ROS, GERD  ,  Endo/Other  negative endocrine ROS  Renal/GU Renal disease     Musculoskeletal negative musculoskeletal ROS (+)   Abdominal   Peds  Hematology  (+) Blood dyscrasia, anemia , Lab Results      Component                Value               Date                      WBC                      13.3 (H)            11/03/2012                HGB                      12.3                11/03/2012                HCT                      35.8 (L)            11/03/2012                MCV                      92.0                11/03/2012                PLT                      165                 11/03/2012              Anesthesia Other Findings   Reproductive/Obstetrics Lab Results      Component                Value               Date                      PREGTESTUR               NEGATIVE            05/20/2021  Anesthesia Physical Anesthesia Plan  ASA: 2  Anesthesia Plan: General   Post-op Pain Management: Tylenol PO (pre-op)   Induction: Intravenous  PONV Risk Score and Plan: 4 or greater and Ondansetron, Dexamethasone, Propofol infusion and TIVA  Airway Management Planned: LMA  Additional  Equipment: None  Intra-op Plan:   Post-operative Plan: Extubation in OR  Informed Consent: I have reviewed the patients History and Physical, chart, labs and discussed the procedure including the risks, benefits and alternatives for the proposed anesthesia with the patient or authorized representative who has indicated his/her understanding and acceptance.     Dental advisory given  Plan Discussed with: CRNA and Anesthesiologist  Anesthesia Plan Comments:         Anesthesia Quick Evaluation

## 2021-05-20 NOTE — Op Note (Signed)
Preoperative diagnosis: right ureteral calculus  Postoperative diagnosis: right ureteral calculus  Procedure:  Cystoscopy Diagnostic semi-rigid and flexible right ureteroscopy right 39F x 26cm ureteral stent placement -no tether right retrograde pyelography with interpretation  Surgeon: Jacalyn Lefevre, MD  Anesthesia: General  Complications: None  Intraoperative findings:  Normal urethra Bilateral lobe hypertrophy prostatic urethra Bilateral orthotropic ureteral orifices right retrograde pyelography demonstrated a filling defect within the right lower pole consistent with the patients known calculus without other abnormalities. Bladder mucosa normal without masses   EBL: Minimal  Specimens: r none  Disposition of specimens: [facility] for stone analysis  Indication: Lisa Kelly is a 46 y.o.   patient with microscopic hematuria and found to have 1 cm right renal calculus. associated right symptoms. After reviewing the management options for treatment, the patient elected to proceed with the above surgical procedure(s). We have discussed the potential benefits and risks of the procedure, side effects of the proposed treatment, the likelihood of the patient achieving the goals of the procedure, and any potential problems that might occur during the procedure or recuperation. Informed consent has been obtained.   Description of procedure:  The patient was taken to the operating room and general anesthesia was induced.  The patient was placed in the dorsal lithotomy position, prepped and draped in the usual sterile fashion, and preoperative antibiotics were administered. A preoperative time-out was performed.   Cystourethroscopy was performed.  The patients urethra was examined and was normal. The bladder was then systematically examined in its entirety. There was no evidence for any bladder tumors, stones, or other mucosal pathology.    Attention then turned  to the right ureteral orifice and a ureteral catheter was used to intubate the ureteral orifice.  Omnipaque contrast was injected through the ureteral catheter and a retrograde pyelogram was performed with findings as dictated above.  A 0.38 sensor guidewire was then advanced through the ureteral catheter and up the right ureter into the renal pelvis under fluoroscopic guidance.  The ureteral catheter was then removed.  The 6 Fr semirigid ureteroscope was then advanced into the ureter until a narrowing in the ureter was seen.  The scope was not able to traverse this area.  A second wire was then placed through the ureteroscope and advanced to the kidney with fluoroscopic guidance.  The ureteroscope was removed.  One wire was secured as the safety wire.  The inner sheath of ureteral access sheath was placed over the working wire and gently advanced with fluoroscopic guidance until resistance was met in the distal ureter.  The access sheath was removed.  One final attempt at placing the flexible ureteroscope over the wire was made again without success due to the narrowing the distal ureter.  Decision was made to place a stent and return to the operating room for staged procedure.   The wire was then backloaded through the cystoscope and a ureteral stent was advance over the wire using Seldinger technique.  The stent was positioned appropriately under fluoroscopic and cystoscopic guidance.  The wire was then removed with an adequate stent curl noted in the renal pelvis as well as in the bladder.  The bladder was then emptied and the procedure ended.  The patient appeared to tolerate the procedure well and without complications.  The patient was able to be awakened and transferred to the recovery unit in satisfactory condition.   Disposition: No tether was left on the stent and the patient be scheduled for repeat ureteroscopy with  laser lithotripsy in approximately 2 weeks.

## 2021-05-20 NOTE — Transfer of Care (Signed)
Immediate Anesthesia Transfer of Care Note  Patient: Lisa Kelly  Procedure(s) Performed: CYSTOSCOPY WITH RETROGRADE PYELOGRAM, URETEROSCOPY AND STENT PLACEMENT (Right: Urethra)  Patient Location: PACU  Anesthesia Type:General  Level of Consciousness: awake, alert , oriented and patient cooperative  Airway & Oxygen Therapy: Patient Spontanous Breathing and Patient connected to face mask oxygen  Post-op Assessment: Report given to RN, Post -op Vital signs reviewed and stable and Patient moving all extremities  Post vital signs: Reviewed and stable  Last Vitals:  Vitals Value Taken Time  BP 139/74 05/20/21 0802  Temp    Pulse 79 05/20/21 0804  Resp 13 05/20/21 0804  SpO2 100 % 05/20/21 0804  Vitals shown include unvalidated device data.  Last Pain:  Vitals:   05/20/21 0605  TempSrc: Oral  PainSc: 2          Complications: No notable events documented.

## 2021-05-21 ENCOUNTER — Encounter (HOSPITAL_BASED_OUTPATIENT_CLINIC_OR_DEPARTMENT_OTHER): Payer: Self-pay | Admitting: Urology

## 2021-05-23 ENCOUNTER — Other Ambulatory Visit: Payer: Self-pay | Admitting: Urology

## 2021-05-30 ENCOUNTER — Other Ambulatory Visit: Payer: Self-pay

## 2021-05-30 ENCOUNTER — Encounter (HOSPITAL_BASED_OUTPATIENT_CLINIC_OR_DEPARTMENT_OTHER): Payer: Self-pay | Admitting: Urology

## 2021-05-30 NOTE — Progress Notes (Addendum)
Spoke w/ via phone for pre-op interview---pt Lab needs dos----urine pregnancy POCT               Lab results------none COVID test -----patient states asymptomatic no test needed Arrive at -------0900 on 06/03/20 NPO after MN NO Solid Food.  Clear liquids from MN until---0800 Med rec completed Medications to take morning of surgery -----Keflex (if pt has not finished prescription), Advair, & Tramadol prn Diabetic medication -----n/a Patient instructed no nail polish to be worn day of surgery Patient instructed to bring photo id and insurance card day of surgery Patient aware to have Driver (ride ) / caregiver    for 24 hours after surgery - husband Sports administrator Patient Special Instructions -----Bring inhaler. Pre-Op special Istructions -----none Patient verbalized understanding of instructions that were given at this phone interview. Patient denies shortness of breath, chest pain, fever, cough at this phone interview.   Patient was just had surgery at [facility] on 05/20/21. No changes to medical hx since 05/20/21 per pt.

## 2021-06-03 ENCOUNTER — Encounter (HOSPITAL_BASED_OUTPATIENT_CLINIC_OR_DEPARTMENT_OTHER)
Admission: RE | Disposition: A | Discharge status: Home / Self Care | Payer: Self-pay | Source: Home / Self Care | Attending: Urology

## 2021-06-03 ENCOUNTER — Ambulatory Visit (HOSPITAL_BASED_OUTPATIENT_CLINIC_OR_DEPARTMENT_OTHER): Payer: Managed Care, Other (non HMO) | Admitting: Anesthesiology

## 2021-06-03 ENCOUNTER — Ambulatory Visit (HOSPITAL_BASED_OUTPATIENT_CLINIC_OR_DEPARTMENT_OTHER)
Admission: RE | Admit: 2021-06-03 | Discharge: 2021-06-03 | Disposition: A | Discharge status: Home / Self Care | Payer: Managed Care, Other (non HMO) | Attending: Urology | Admitting: Urology

## 2021-06-03 ENCOUNTER — Encounter (HOSPITAL_BASED_OUTPATIENT_CLINIC_OR_DEPARTMENT_OTHER): Payer: Self-pay | Admitting: Urology

## 2021-06-03 ENCOUNTER — Other Ambulatory Visit: Payer: Self-pay

## 2021-06-03 DIAGNOSIS — J45909 Unspecified asthma, uncomplicated: Secondary | ICD-10-CM | POA: Diagnosis not present

## 2021-06-03 DIAGNOSIS — N200 Calculus of kidney: Secondary | ICD-10-CM | POA: Diagnosis present

## 2021-06-03 HISTORY — PX: CYSTOSCOPY/URETEROSCOPY/HOLMIUM LASER/STENT PLACEMENT: SHX6546

## 2021-06-03 LAB — POCT PREGNANCY, URINE: Preg Test, Ur: NEGATIVE

## 2021-06-03 SURGERY — CYSTOSCOPY/URETEROSCOPY/HOLMIUM LASER/STENT PLACEMENT
Anesthesia: General | Site: Ureter | Laterality: Right

## 2021-06-03 MED ORDER — ACETAMINOPHEN 500 MG PO TABS
ORAL_TABLET | ORAL | Status: AC
  Filled 2021-06-03: qty 2

## 2021-06-03 MED ORDER — LIDOCAINE HCL (CARDIAC) PF 100 MG/5ML IV SOSY
PREFILLED_SYRINGE | INTRAVENOUS | Status: DC | PRN
  Administered 2021-06-03: 60 mg via INTRAVENOUS

## 2021-06-03 MED ORDER — ACETAMINOPHEN 500 MG PO TABS
1000.0000 mg | ORAL_TABLET | Freq: Once | ORAL | Status: AC
  Administered 2021-06-03: 09:00:00 1000 mg via ORAL

## 2021-06-03 MED ORDER — ONDANSETRON HCL 4 MG/2ML IJ SOLN
INTRAMUSCULAR | Status: AC
  Filled 2021-06-03: qty 2

## 2021-06-03 MED ORDER — DEXAMETHASONE SODIUM PHOSPHATE 4 MG/ML IJ SOLN
INTRAMUSCULAR | Status: DC | PRN
  Administered 2021-06-03: 10 mg via INTRAVENOUS

## 2021-06-03 MED ORDER — LACTATED RINGERS IV SOLN: INTRAVENOUS | Status: DC

## 2021-06-03 MED ORDER — MIDAZOLAM HCL 5 MG/5ML IJ SOLN
INTRAMUSCULAR | Status: DC | PRN
  Administered 2021-06-03: 2 mg via INTRAVENOUS

## 2021-06-03 MED ORDER — LIDOCAINE HCL (PF) 2 % IJ SOLN
INTRAMUSCULAR | Status: AC
  Filled 2021-06-03: qty 5

## 2021-06-03 MED ORDER — PROPOFOL 10 MG/ML IV BOLUS
INTRAVENOUS | Status: AC
  Filled 2021-06-03: qty 20

## 2021-06-03 MED ORDER — PHENYLEPHRINE HCL (PRESSORS) 10 MG/ML IV SOLN
INTRAVENOUS | Status: DC | PRN
  Administered 2021-06-03 (×2): 80 ug via INTRAVENOUS

## 2021-06-03 MED ORDER — FENTANYL CITRATE (PF) 100 MCG/2ML IJ SOLN
INTRAMUSCULAR | Status: AC
  Filled 2021-06-03: qty 2

## 2021-06-03 MED ORDER — 0.9 % SODIUM CHLORIDE (POUR BTL) OPTIME
TOPICAL | Status: DC | PRN
  Administered 2021-06-03: 11:00:00 500 mL

## 2021-06-03 MED ORDER — MIDAZOLAM HCL 2 MG/2ML IJ SOLN
INTRAMUSCULAR | Status: AC
  Filled 2021-06-03: qty 2

## 2021-06-03 MED ORDER — TRAMADOL HCL 50 MG PO TABS: 50.0000 mg | ORAL_TABLET | Freq: Four times a day (QID) | ORAL | 0 refills | Status: AC | PRN

## 2021-06-03 MED ORDER — DEXAMETHASONE SODIUM PHOSPHATE 10 MG/ML IJ SOLN
INTRAMUSCULAR | Status: AC
  Filled 2021-06-03: qty 1

## 2021-06-03 MED ORDER — PHENYLEPHRINE 40 MCG/ML (10ML) SYRINGE FOR IV PUSH (FOR BLOOD PRESSURE SUPPORT)
PREFILLED_SYRINGE | INTRAVENOUS | Status: AC
  Filled 2021-06-03: qty 10

## 2021-06-03 MED ORDER — FENTANYL CITRATE (PF) 100 MCG/2ML IJ SOLN: 25.0000 ug | INTRAMUSCULAR | Status: DC | PRN

## 2021-06-03 MED ORDER — ONDANSETRON HCL 4 MG/2ML IJ SOLN
INTRAMUSCULAR | Status: DC | PRN
Start: 2021-06-03 — End: 2021-06-03
  Administered 2021-06-03: 4 mg via INTRAVENOUS

## 2021-06-03 MED ORDER — SODIUM CHLORIDE 0.9 % IR SOLN
Status: DC | PRN
  Administered 2021-06-03: 3000 mL

## 2021-06-03 MED ORDER — FENTANYL CITRATE (PF) 250 MCG/5ML IJ SOLN
INTRAMUSCULAR | Status: AC
  Filled 2021-06-03: qty 5

## 2021-06-03 MED ORDER — FENTANYL CITRATE (PF) 100 MCG/2ML IJ SOLN
INTRAMUSCULAR | Status: DC | PRN
  Administered 2021-06-03 (×3): 50 ug via INTRAVENOUS
  Administered 2021-06-03: 25 ug via INTRAVENOUS
  Administered 2021-06-03: 50 ug via INTRAVENOUS
  Administered 2021-06-03 (×3): 25 ug via INTRAVENOUS
  Administered 2021-06-03: 50 ug via INTRAVENOUS

## 2021-06-03 MED ORDER — OXYBUTYNIN CHLORIDE 5 MG PO TABS: 5.0000 mg | ORAL_TABLET | Freq: Three times a day (TID) | ORAL | 0 refills | Status: AC | PRN

## 2021-06-03 MED ORDER — CEFAZOLIN SODIUM-DEXTROSE 2-4 GM/100ML-% IV SOLN
2.0000 g | Freq: Once | INTRAVENOUS | Status: AC
  Administered 2021-06-03: 10:00:00 2 g via INTRAVENOUS

## 2021-06-03 MED ORDER — CEFAZOLIN SODIUM-DEXTROSE 2-4 GM/100ML-% IV SOLN
INTRAVENOUS | Status: AC
  Filled 2021-06-03: qty 100

## 2021-06-03 MED ORDER — CEPHALEXIN 500 MG PO CAPS: 500.0000 mg | ORAL_CAPSULE | Freq: Once | ORAL | 0 refills | Status: AC

## 2021-06-03 MED ORDER — PROPOFOL 10 MG/ML IV BOLUS
INTRAVENOUS | Status: DC | PRN
  Administered 2021-06-03: 200 mg via INTRAVENOUS
  Administered 2021-06-03: 50 mg via INTRAVENOUS

## 2021-06-03 SURGICAL SUPPLY — 25 items
BAG DRAIN URO-CYSTO SKYTR STRL (DRAIN) ×3 IMPLANT
BAG DRN UROCATH (DRAIN) ×1
BASKET ZERO TIP NITINOL 2.4FR (BASKET) ×2 IMPLANT
BSKT STON RTRVL ZERO TP 2.4FR (BASKET) ×1
CATH URET 5FR 28IN OPEN ENDED (CATHETERS) ×3 IMPLANT
CLOTH BEACON ORANGE TIMEOUT ST (SAFETY) ×3 IMPLANT
COVER DOME SNAP 22 D (MISCELLANEOUS) ×2 IMPLANT
DRSG TEGADERM 4X4.75 (GAUZE/BANDAGES/DRESSINGS) IMPLANT
GLOVE SURG ENC MOIS LTX SZ6.5 (GLOVE) ×3 IMPLANT
GLOVE SURG NEOP MICRO LF SZ6.5 (GLOVE) ×4 IMPLANT
GOWN STRL REUS W/ TWL LRG LVL3 (GOWN DISPOSABLE) IMPLANT
GOWN STRL REUS W/TWL LRG LVL3 (GOWN DISPOSABLE) ×9 IMPLANT
GUIDEWIRE STR DUAL SENSOR (WIRE) ×5 IMPLANT
IV NS IRRIG 3000ML ARTHROMATIC (IV SOLUTION) ×3 IMPLANT
KIT TURNOVER CYSTO (KITS) ×3 IMPLANT
MANIFOLD NEPTUNE II (INSTRUMENTS) ×3 IMPLANT
NS IRRIG 500ML POUR BTL (IV SOLUTION) ×2 IMPLANT
PACK CYSTO (CUSTOM PROCEDURE TRAY) ×3 IMPLANT
SHEATH URETERAL 12FRX28CM (UROLOGICAL SUPPLIES) ×2 IMPLANT
STENT URET 6FRX26 CONTOUR (STENTS) ×2 IMPLANT
TRACTIP FLEXIVA PULS ID 200XHI (Laser) IMPLANT
TRACTIP FLEXIVA PULSE ID 200 (Laser) ×3
TUBE CONNECTING 12'X1/4 (SUCTIONS) ×1
TUBE CONNECTING 12X1/4 (SUCTIONS) ×2 IMPLANT
TUBING UROLOGY SET (TUBING) ×3 IMPLANT

## 2021-06-03 NOTE — Transfer of Care (Signed)
Immediate Anesthesia Transfer of Care Note  Patient: Lisa Kelly  Procedure(s) Performed: Procedure(s) (LRB): CYSTOSCOPY/URETEROSCOPY/HOLMIUM LASER/STENT EXCHANGE (Right)  Patient Location: PACU  Anesthesia Type: General  Level of Consciousness: awake, sedated, patient cooperative and responds to stimulation  Airway & Oxygen Therapy: Patient Spontanous Breathing and Patient connected to [place] 02 and soft FM   Post-op Assessment: Report given to PACU RN, Post -op Vital signs reviewed and stable and Patient moving all extremities  Post vital signs: Reviewed and stable  Complications: No apparent anesthesia complications

## 2021-06-03 NOTE — Anesthesia Preprocedure Evaluation (Addendum)
Anesthesia Evaluation  Patient identified by MRN, date of birth, ID band Patient awake    Reviewed: Allergy & Precautions, NPO status , Patient's Chart, lab work & pertinent test results  History of Anesthesia Complications (+) PONV  Airway Mallampati: I  TM Distance: >3 FB Neck ROM: Full    Dental no notable dental hx. (+) Teeth Intact, Dental Advisory Given   Pulmonary asthma ,    Pulmonary exam normal breath sounds clear to auscultation       Cardiovascular negative cardio ROS Normal cardiovascular exam Rhythm:Regular Rate:Normal     Neuro/Psych negative neurological ROS  negative psych ROS   GI/Hepatic Neg liver ROS, GERD  ,  Endo/Other  negative endocrine ROS  Renal/GU negative Renal ROS  negative genitourinary   Musculoskeletal negative musculoskeletal ROS (+)   Abdominal   Peds  Hematology negative hematology ROS (+)   Anesthesia Other Findings   Reproductive/Obstetrics                            Anesthesia Physical Anesthesia Plan  ASA: 2  Anesthesia Plan: General   Post-op Pain Management: Tylenol PO (pre-op)   Induction: Intravenous  PONV Risk Score and Plan: 4 or greater and Ondansetron, Dexamethasone, Midazolam and Treatment may vary due to age or medical condition  Airway Management Planned: LMA  Additional Equipment:   Intra-op Plan:   Post-operative Plan: Extubation in OR  Informed Consent: I have reviewed the patients History and Physical, chart, labs and discussed the procedure including the risks, benefits and alternatives for the proposed anesthesia with the patient or authorized representative who has indicated his/her understanding and acceptance.     Dental advisory given  Plan Discussed with: CRNA  Anesthesia Plan Comments:         Anesthesia Quick Evaluation

## 2021-06-03 NOTE — Anesthesia Postprocedure Evaluation (Signed)
Anesthesia Post Note  Patient: Lisa Kelly  Procedure(s) Performed: CYSTOSCOPY/URETEROSCOPY/HOLMIUM LASER/STENT EXCHANGE (Right: Ureter)     Patient location during evaluation: PACU Anesthesia Type: General Level of consciousness: awake and alert Pain management: pain level controlled Vital Signs Assessment: post-procedure vital signs reviewed and stable Respiratory status: spontaneous breathing, nonlabored ventilation, respiratory function stable and patient connected to nasal cannula oxygen Cardiovascular status: blood pressure returned to baseline and stable Postop Assessment: no apparent nausea or vomiting Anesthetic complications: no   No notable events documented.  Last Vitals:  Vitals:   06/03/21 1145 06/03/21 1215  BP: (!) 144/68 (!) 148/88  Pulse: 79 85  Resp: 10 16  Temp:  36.7 C  SpO2: 96% 97%    Last Pain:  Vitals:   06/03/21 1215  TempSrc:   PainSc: 0-No pain                 Bradie Sangiovanni L Caidance Sybert

## 2021-06-03 NOTE — Discharge Instructions (Addendum)
DISCHARGE INSTRUCTIONS FOR KIDNEY STONE/URETERAL STENT   MEDICATIONS:  1. Resume all your other meds from home  2. AZO over the counter can help with the burning/stinging when you urinate. 3. Tramadol is for moderate/severe pain, otherwise taking up to 1000 mg every 6 hours of plainTylenol will help treat your pain.   4. Take Cephalexin one hour prior to removal of your stent.  5. Flomax can help with stent discomfort and oxybuytnin is for overactive bladder symptoms    ACTIVITY:  1. No strenuous activity x 1week  2. No driving while on narcotic pain medications  3. Drink plenty of water  4. Continue to walk at home - you can still get blood clots when you are at home, so keep active, but don't over do it.  5. May return to work/school tomorrow or when you feel ready   BATHING:  1. You can shower and we recommend daily showers  2. You have a string coming from your urethra: The stent string is attached to your ureteral stent. Do not pull on this.   SIGNS/SYMPTOMS TO CALL:  Please call us if you have a fever greater than 101.5, uncontrolled nausea/vomiting, uncontrolled pain, dizziness, unable to urinate, bloody urine, chest pain, shortness of breath, leg swelling, leg pain, redness around wound, drainage from wound, or any other concerns or questions.   You can reach [place] at [phone].   FOLLOW-UP:  1. You have a string attached to your stent, you may remove it on Friday, January 27. To do this, pull the strings until the stent is completely removed. You may feel an odd sensation in your back.    Post Anesthesia Home Care Instructions  Activity: Get plenty of rest for the remainder of the day. A responsible individual must stay with you for 24 hours following the procedure.  For the next 24 hours, DO NOT: -Drive a car -Advertising copywriter -Drink alcoholic beverages -Take any medication unless instructed by your physician -Make any legal decisions or sign important  papers.  Meals: Start with liquid foods such as gelatin or soup. Progress to regular foods as tolerated. Avoid greasy, spicy, heavy foods. If nausea and/or vomiting occur, drink only clear liquids until the nausea and/or vomiting subsides. Call your physician if vomiting continues.  Special Instructions/Symptoms: Your throat may feel dry or sore from the anesthesia or the breathing tube placed in your throat during surgery. If this causes discomfort, gargle with warm salt water. The discomfort should disappear within 24 hours.

## 2021-06-03 NOTE — Interval H&P Note (Signed)
History and Physical Interval Note: Patient previously underwent right ureteral stent placement only due to narrow ureter prohibiting scope movement on 05/20/21. She now returns for definitive stone management.   06/03/2021 10:03 AM  Lisa Kelly  has presented today for surgery, with the diagnosis of RIGHT RENAL CALCULUS.  The various methods of treatment have been discussed with the patient and family. After consideration of risks, benefits and other options for treatment, the patient has consented to  Procedure(s): CYSTOSCOPY/URETEROSCOPY/HOLMIUM LASER/STENT PLACEMENT (Right) as a surgical intervention.  The patient's history has been reviewed, patient examined, no change in status, stable for surgery.  I have reviewed the patient's chart and labs.  Questions were answered to the patient's satisfaction.     Nataliee Shurtz D Keynan Heffern

## 2021-06-03 NOTE — Anesthesia Procedure Notes (Signed)
Procedure Name: LMA Insertion Date/Time: 06/03/2021 10:32 AM Performed by: Jessica Priest, CRNA Pre-anesthesia Checklist: Patient identified, Emergency Drugs available, Suction available, Patient being monitored and Timeout performed Patient Re-evaluated:Patient Re-evaluated prior to induction Oxygen Delivery Method: Circle system utilized Preoxygenation: Pre-oxygenation with 100% oxygen Induction Type: IV induction Ventilation: Mask ventilation without difficulty LMA: LMA inserted LMA Size: 4.0 Number of attempts: 1 Airway Equipment and Method: Bite block Placement Confirmation: positive ETCO2, breath sounds checked- equal and bilateral and CO2 detector Tube secured with: Tape Dental Injury: Teeth and Oropharynx as per pre-operative assessment

## 2021-06-03 NOTE — Op Note (Signed)
Preoperative diagnosis: right renal calculus  Postoperative diagnosis: right renal calculus  Procedure:  Cystoscopy right ureteroscopy, laser lithotripsy, basket stone extraction right 67F x 24 ureteral stent exchange - with tether   Surgeon: Kasandra Knudsen, MD  Anesthesia: General  Complications: None  Intraoperative findings:  Normal urethra Bilateral orthotropic ureteral orifices Bladder mucosa normal without masses  1 cm right renal pelvis calculus  EBL: Minimal  Specimens: right renal calculus  Disposition of specimens: [facility] for stone analysis  Indication: Lisa Kelly is a 46 y.o.   patient with a 1cm right ureteral stone and associated right symptoms who have previously underwent right ureteral stent placement. After reviewing the management options for treatment, the patient elected to proceed with the above surgical procedure(s). We have discussed the potential benefits and risks of the procedure, side effects of the proposed treatment, the likelihood of the patient achieving the goals of the procedure, and any potential problems that might occur during the procedure or recuperation. Informed consent has been obtained.   Description of procedure:  The patient was taken to the operating room and general anesthesia was induced.  The patient was placed in the dorsal lithotomy position, prepped and draped in the usual sterile fashion, and preoperative antibiotics were administered. A preoperative time-out was performed.   Cystourethroscopy was performed.  The patients urethra was examined and was normal. The bladder was then systematically examined in its entirety. There was no evidence for any bladder tumors, stones, or other mucosal pathology.    The existing right ureteral stent was seen emanating from the right ureteral orifice.  Graspers were used to bring the stent to the urethral meatus.  A 0.38 sensor wire was then advanced through the  ureteral stent and into the ureter and advanced in the kidney with fluoroscopic guidance.  The stent was removed and deemed to be intact in its entirety.  Next semirigid ureteroscopy then took place alongside the wire.  No stone was encountered in the distal and mid ureter.  A second sensor wire was placed through the ureteroscope and advanced to the kidney fluoroscopic guidance.  The ureteroscope was removed.  The ureteral access sheath was then placed over the second wire with fluoroscopic guidance.  It was gently advanced into the proximal ureter.  The inner sheath and wire.  Next flexible ureteroscopy then took place.  A 1 cm renal pelvis calculus was encountered.  The stone was then fragmented with the 242 micron holmium laser fiber.  All larger stone fragments were then removed from the ureter with the 0 tip basket.  Reinspection of the ureter revealed no remaining visible stones or fragments.  The flexible ureteroscope was removed and unison with the access sheath.  No trauma to the ureter was noted.  The wire was then backloaded through the cystoscope and a ureteral stent was advance over the wire using Seldinger technique.  The stent was positioned appropriately under fluoroscopic and cystoscopic guidance.  The wire was then removed with an adequate stent curl noted in the renal pelvis as well as in the bladder.  The bladder was then emptied and the procedure ended.  The patient appeared to tolerate the procedure well and without complications.  The patient was able to be awakened and transferred to the recovery unit in satisfactory condition.   Disposition: The tether of the stent was left on and tucked inside the patient's vagina.  Instructions for removing the stent have been provided to the patient.

## 2021-06-04 ENCOUNTER — Encounter (HOSPITAL_BASED_OUTPATIENT_CLINIC_OR_DEPARTMENT_OTHER): Payer: Self-pay | Admitting: Urology
